# Patient Record
Sex: Female | Born: 1950 | Race: White | Hispanic: No | Marital: Married | Smoking: Never smoker
Health system: Southern US, Community
[De-identification: ages and names within clinical notes are randomized; demographics above are authoritative.]

## PROBLEM LIST (undated history)

## (undated) DIAGNOSIS — E785 Hyperlipidemia, unspecified: Secondary | ICD-10-CM

## (undated) DIAGNOSIS — R918 Other nonspecific abnormal finding of lung field: Secondary | ICD-10-CM

## (undated) HISTORY — DX: Hyperlipidemia, unspecified: E78.5

## (undated) HISTORY — PX: TUBAL LIGATION: SHX77

## (undated) HISTORY — DX: Other nonspecific abnormal finding of lung field: R91.8

---

## 1998-11-10 ENCOUNTER — Other Ambulatory Visit: Admission: RE | Admit: 1998-11-10 | Discharge: 1998-11-10 | Payer: Self-pay | Admitting: Obstetrics and Gynecology

## 2000-02-08 ENCOUNTER — Other Ambulatory Visit: Admission: RE | Admit: 2000-02-08 | Discharge: 2000-02-08 | Payer: Self-pay | Admitting: Obstetrics and Gynecology

## 2001-02-13 ENCOUNTER — Other Ambulatory Visit: Admission: RE | Admit: 2001-02-13 | Discharge: 2001-02-13 | Payer: Self-pay | Admitting: Obstetrics and Gynecology

## 2002-02-26 ENCOUNTER — Other Ambulatory Visit: Admission: RE | Admit: 2002-02-26 | Discharge: 2002-02-26 | Payer: Self-pay | Admitting: Obstetrics and Gynecology

## 2002-07-26 ENCOUNTER — Other Ambulatory Visit: Admission: RE | Admit: 2002-07-26 | Discharge: 2002-07-26 | Payer: Self-pay | Admitting: Obstetrics and Gynecology

## 2003-09-18 ENCOUNTER — Other Ambulatory Visit: Admission: RE | Admit: 2003-09-18 | Discharge: 2003-09-18 | Payer: Self-pay | Admitting: Obstetrics and Gynecology

## 2005-02-01 ENCOUNTER — Ambulatory Visit: Payer: Self-pay | Admitting: Internal Medicine

## 2005-02-24 ENCOUNTER — Ambulatory Visit: Payer: Self-pay | Admitting: Internal Medicine

## 2005-02-26 ENCOUNTER — Ambulatory Visit: Payer: Self-pay | Admitting: Internal Medicine

## 2005-03-01 ENCOUNTER — Ambulatory Visit: Payer: Self-pay | Admitting: Internal Medicine

## 2005-03-04 ENCOUNTER — Ambulatory Visit: Payer: Self-pay | Admitting: Internal Medicine

## 2005-03-08 ENCOUNTER — Ambulatory Visit: Payer: Self-pay | Admitting: Internal Medicine

## 2005-03-12 ENCOUNTER — Ambulatory Visit: Payer: Self-pay | Admitting: Internal Medicine

## 2005-03-16 ENCOUNTER — Ambulatory Visit: Payer: Self-pay | Admitting: Internal Medicine

## 2005-03-19 ENCOUNTER — Ambulatory Visit: Payer: Self-pay | Admitting: Internal Medicine

## 2005-03-23 ENCOUNTER — Ambulatory Visit: Payer: Self-pay | Admitting: Internal Medicine

## 2005-03-25 ENCOUNTER — Ambulatory Visit: Payer: Self-pay | Admitting: Internal Medicine

## 2005-03-26 ENCOUNTER — Ambulatory Visit: Payer: Self-pay | Admitting: Internal Medicine

## 2005-03-30 ENCOUNTER — Ambulatory Visit: Payer: Self-pay | Admitting: Internal Medicine

## 2005-04-01 ENCOUNTER — Ambulatory Visit: Payer: Self-pay | Admitting: Internal Medicine

## 2005-04-06 ENCOUNTER — Ambulatory Visit: Payer: Self-pay | Admitting: Internal Medicine

## 2005-04-09 ENCOUNTER — Ambulatory Visit: Payer: Self-pay | Admitting: Internal Medicine

## 2005-04-13 ENCOUNTER — Ambulatory Visit: Payer: Self-pay | Admitting: Internal Medicine

## 2005-04-19 ENCOUNTER — Ambulatory Visit: Payer: Self-pay | Admitting: Internal Medicine

## 2005-04-27 ENCOUNTER — Ambulatory Visit: Payer: Self-pay | Admitting: Internal Medicine

## 2005-04-30 ENCOUNTER — Ambulatory Visit: Payer: Self-pay | Admitting: Internal Medicine

## 2005-05-03 ENCOUNTER — Ambulatory Visit: Payer: Self-pay | Admitting: Internal Medicine

## 2005-05-06 ENCOUNTER — Ambulatory Visit: Payer: Self-pay | Admitting: Internal Medicine

## 2005-05-12 ENCOUNTER — Ambulatory Visit: Payer: Self-pay | Admitting: Internal Medicine

## 2005-05-14 ENCOUNTER — Ambulatory Visit: Payer: Self-pay | Admitting: Internal Medicine

## 2005-05-18 ENCOUNTER — Ambulatory Visit: Payer: Self-pay | Admitting: Internal Medicine

## 2005-05-20 ENCOUNTER — Ambulatory Visit: Payer: Self-pay | Admitting: Internal Medicine

## 2005-05-26 ENCOUNTER — Ambulatory Visit: Payer: Self-pay | Admitting: Internal Medicine

## 2005-05-28 ENCOUNTER — Ambulatory Visit: Payer: Self-pay | Admitting: Internal Medicine

## 2005-05-31 ENCOUNTER — Ambulatory Visit: Payer: Self-pay | Admitting: Internal Medicine

## 2005-06-03 ENCOUNTER — Ambulatory Visit: Payer: Self-pay | Admitting: Internal Medicine

## 2005-06-09 ENCOUNTER — Ambulatory Visit: Payer: Self-pay | Admitting: Internal Medicine

## 2005-06-11 ENCOUNTER — Ambulatory Visit: Payer: Self-pay | Admitting: Internal Medicine

## 2005-06-16 ENCOUNTER — Ambulatory Visit: Payer: Self-pay | Admitting: Internal Medicine

## 2005-06-22 ENCOUNTER — Ambulatory Visit: Payer: Self-pay | Admitting: Internal Medicine

## 2005-06-30 ENCOUNTER — Ambulatory Visit: Payer: Self-pay | Admitting: Internal Medicine

## 2005-07-07 ENCOUNTER — Ambulatory Visit: Payer: Self-pay | Admitting: Internal Medicine

## 2005-07-12 ENCOUNTER — Ambulatory Visit: Payer: Self-pay | Admitting: Internal Medicine

## 2005-07-20 ENCOUNTER — Ambulatory Visit: Payer: Self-pay | Admitting: Internal Medicine

## 2005-07-27 ENCOUNTER — Ambulatory Visit: Payer: Self-pay | Admitting: Internal Medicine

## 2005-08-05 ENCOUNTER — Ambulatory Visit: Payer: Self-pay | Admitting: Internal Medicine

## 2005-08-11 ENCOUNTER — Ambulatory Visit: Payer: Self-pay | Admitting: Internal Medicine

## 2005-08-19 ENCOUNTER — Ambulatory Visit: Payer: Self-pay | Admitting: Internal Medicine

## 2005-08-27 ENCOUNTER — Ambulatory Visit: Payer: Self-pay | Admitting: Internal Medicine

## 2005-09-03 ENCOUNTER — Ambulatory Visit: Payer: Self-pay | Admitting: Internal Medicine

## 2005-09-06 ENCOUNTER — Ambulatory Visit: Payer: Self-pay | Admitting: Internal Medicine

## 2005-09-10 ENCOUNTER — Ambulatory Visit: Payer: Self-pay | Admitting: Internal Medicine

## 2005-09-17 ENCOUNTER — Ambulatory Visit: Payer: Self-pay | Admitting: Internal Medicine

## 2005-09-24 ENCOUNTER — Ambulatory Visit: Payer: Self-pay | Admitting: Internal Medicine

## 2005-10-01 ENCOUNTER — Ambulatory Visit: Payer: Self-pay | Admitting: Internal Medicine

## 2005-10-08 ENCOUNTER — Ambulatory Visit: Payer: Self-pay | Admitting: Internal Medicine

## 2005-10-18 ENCOUNTER — Ambulatory Visit: Payer: Self-pay | Admitting: Internal Medicine

## 2005-10-25 ENCOUNTER — Ambulatory Visit: Payer: Self-pay | Admitting: Internal Medicine

## 2005-11-01 ENCOUNTER — Ambulatory Visit: Payer: Self-pay | Admitting: Internal Medicine

## 2005-11-08 ENCOUNTER — Ambulatory Visit: Payer: Self-pay | Admitting: Internal Medicine

## 2005-11-15 ENCOUNTER — Ambulatory Visit: Payer: Self-pay | Admitting: Internal Medicine

## 2005-11-22 ENCOUNTER — Ambulatory Visit: Payer: Self-pay | Admitting: Internal Medicine

## 2005-12-06 ENCOUNTER — Ambulatory Visit: Payer: Self-pay | Admitting: Internal Medicine

## 2005-12-13 ENCOUNTER — Ambulatory Visit: Payer: Self-pay | Admitting: Internal Medicine

## 2005-12-21 ENCOUNTER — Ambulatory Visit: Payer: Self-pay | Admitting: Internal Medicine

## 2005-12-28 ENCOUNTER — Ambulatory Visit: Payer: Self-pay | Admitting: Internal Medicine

## 2006-01-05 ENCOUNTER — Ambulatory Visit: Payer: Self-pay | Admitting: Internal Medicine

## 2006-01-11 ENCOUNTER — Ambulatory Visit: Payer: Self-pay | Admitting: Internal Medicine

## 2006-01-18 ENCOUNTER — Ambulatory Visit: Payer: Self-pay | Admitting: Internal Medicine

## 2006-01-25 ENCOUNTER — Ambulatory Visit: Payer: Self-pay | Admitting: Internal Medicine

## 2006-02-01 ENCOUNTER — Ambulatory Visit: Payer: Self-pay | Admitting: Internal Medicine

## 2006-02-02 ENCOUNTER — Ambulatory Visit: Payer: Self-pay | Admitting: Internal Medicine

## 2006-02-08 ENCOUNTER — Ambulatory Visit: Payer: Self-pay | Admitting: Internal Medicine

## 2006-02-17 ENCOUNTER — Ambulatory Visit: Payer: Self-pay | Admitting: Internal Medicine

## 2006-02-25 ENCOUNTER — Ambulatory Visit: Payer: Self-pay | Admitting: Internal Medicine

## 2006-03-14 ENCOUNTER — Ambulatory Visit: Payer: Self-pay | Admitting: Internal Medicine

## 2006-03-22 ENCOUNTER — Ambulatory Visit: Payer: Self-pay | Admitting: Internal Medicine

## 2006-03-29 ENCOUNTER — Ambulatory Visit: Payer: Self-pay | Admitting: Internal Medicine

## 2006-04-05 ENCOUNTER — Ambulatory Visit: Payer: Self-pay | Admitting: Internal Medicine

## 2006-04-13 ENCOUNTER — Ambulatory Visit: Payer: Self-pay | Admitting: Internal Medicine

## 2006-04-20 ENCOUNTER — Ambulatory Visit: Payer: Self-pay | Admitting: Internal Medicine

## 2006-04-26 ENCOUNTER — Ambulatory Visit: Payer: Self-pay | Admitting: Internal Medicine

## 2006-05-03 ENCOUNTER — Ambulatory Visit: Payer: Self-pay | Admitting: Internal Medicine

## 2006-05-11 ENCOUNTER — Ambulatory Visit: Payer: Self-pay | Admitting: Internal Medicine

## 2006-05-19 ENCOUNTER — Ambulatory Visit: Payer: Self-pay | Admitting: Internal Medicine

## 2006-05-26 ENCOUNTER — Ambulatory Visit: Payer: Self-pay | Admitting: Internal Medicine

## 2006-05-31 ENCOUNTER — Ambulatory Visit: Payer: Self-pay | Admitting: Internal Medicine

## 2006-06-07 ENCOUNTER — Ambulatory Visit: Payer: Self-pay | Admitting: Internal Medicine

## 2006-06-14 ENCOUNTER — Ambulatory Visit: Payer: Self-pay | Admitting: Internal Medicine

## 2006-06-21 ENCOUNTER — Ambulatory Visit: Payer: Self-pay | Admitting: Internal Medicine

## 2006-06-22 ENCOUNTER — Ambulatory Visit: Payer: Self-pay | Admitting: Internal Medicine

## 2006-06-28 ENCOUNTER — Ambulatory Visit: Payer: Self-pay | Admitting: Internal Medicine

## 2006-07-06 ENCOUNTER — Ambulatory Visit: Payer: Self-pay | Admitting: Internal Medicine

## 2006-07-12 ENCOUNTER — Ambulatory Visit: Payer: Self-pay | Admitting: Internal Medicine

## 2006-07-20 ENCOUNTER — Ambulatory Visit: Payer: Self-pay | Admitting: Internal Medicine

## 2006-07-28 ENCOUNTER — Ambulatory Visit: Payer: Self-pay | Admitting: Internal Medicine

## 2006-08-03 ENCOUNTER — Ambulatory Visit: Payer: Self-pay | Admitting: Internal Medicine

## 2006-08-10 ENCOUNTER — Ambulatory Visit: Payer: Self-pay | Admitting: Internal Medicine

## 2006-08-17 ENCOUNTER — Ambulatory Visit: Payer: Self-pay | Admitting: Internal Medicine

## 2006-08-24 ENCOUNTER — Ambulatory Visit: Payer: Self-pay | Admitting: Internal Medicine

## 2006-09-01 ENCOUNTER — Ambulatory Visit: Payer: Self-pay | Admitting: Internal Medicine

## 2006-09-07 ENCOUNTER — Ambulatory Visit: Payer: Self-pay | Admitting: Internal Medicine

## 2006-09-16 ENCOUNTER — Ambulatory Visit: Payer: Self-pay | Admitting: Internal Medicine

## 2006-09-21 ENCOUNTER — Ambulatory Visit: Payer: Self-pay | Admitting: Internal Medicine

## 2006-09-29 ENCOUNTER — Ambulatory Visit: Payer: Self-pay | Admitting: Internal Medicine

## 2006-10-07 ENCOUNTER — Ambulatory Visit: Payer: Self-pay | Admitting: Internal Medicine

## 2006-10-14 ENCOUNTER — Ambulatory Visit: Payer: Self-pay | Admitting: Internal Medicine

## 2006-10-19 ENCOUNTER — Ambulatory Visit: Payer: Self-pay | Admitting: Internal Medicine

## 2006-10-24 ENCOUNTER — Ambulatory Visit: Payer: Self-pay | Admitting: Internal Medicine

## 2006-10-28 ENCOUNTER — Ambulatory Visit: Payer: Self-pay | Admitting: Internal Medicine

## 2006-11-02 ENCOUNTER — Ambulatory Visit: Payer: Self-pay | Admitting: Internal Medicine

## 2006-11-08 ENCOUNTER — Ambulatory Visit: Payer: Self-pay | Admitting: Internal Medicine

## 2006-11-21 ENCOUNTER — Ambulatory Visit: Payer: Self-pay | Admitting: Internal Medicine

## 2006-12-01 ENCOUNTER — Ambulatory Visit: Payer: Self-pay | Admitting: Internal Medicine

## 2006-12-08 ENCOUNTER — Ambulatory Visit: Payer: Self-pay | Admitting: Internal Medicine

## 2006-12-15 ENCOUNTER — Ambulatory Visit: Payer: Self-pay | Admitting: Internal Medicine

## 2006-12-22 ENCOUNTER — Ambulatory Visit: Payer: Self-pay | Admitting: Internal Medicine

## 2006-12-30 ENCOUNTER — Ambulatory Visit: Payer: Self-pay | Admitting: Internal Medicine

## 2007-01-06 ENCOUNTER — Ambulatory Visit: Payer: Self-pay | Admitting: Internal Medicine

## 2007-01-12 ENCOUNTER — Ambulatory Visit: Payer: Self-pay | Admitting: Internal Medicine

## 2007-01-20 ENCOUNTER — Ambulatory Visit: Payer: Self-pay | Admitting: Internal Medicine

## 2007-01-27 ENCOUNTER — Ambulatory Visit: Payer: Self-pay | Admitting: Internal Medicine

## 2007-02-03 ENCOUNTER — Ambulatory Visit: Payer: Self-pay | Admitting: Internal Medicine

## 2007-02-10 ENCOUNTER — Ambulatory Visit: Payer: Self-pay | Admitting: Internal Medicine

## 2007-02-17 ENCOUNTER — Ambulatory Visit: Payer: Self-pay | Admitting: Internal Medicine

## 2007-02-23 ENCOUNTER — Ambulatory Visit: Payer: Self-pay | Admitting: Internal Medicine

## 2007-03-03 ENCOUNTER — Ambulatory Visit: Payer: Self-pay | Admitting: Internal Medicine

## 2007-03-09 ENCOUNTER — Ambulatory Visit: Payer: Self-pay | Admitting: Internal Medicine

## 2007-03-10 ENCOUNTER — Ambulatory Visit: Payer: Self-pay | Admitting: Internal Medicine

## 2007-03-17 ENCOUNTER — Ambulatory Visit: Payer: Self-pay | Admitting: Internal Medicine

## 2007-03-24 ENCOUNTER — Ambulatory Visit: Payer: Self-pay | Admitting: Internal Medicine

## 2007-03-31 ENCOUNTER — Ambulatory Visit: Payer: Self-pay | Admitting: Internal Medicine

## 2007-04-07 ENCOUNTER — Ambulatory Visit: Payer: Self-pay | Admitting: Internal Medicine

## 2007-04-14 ENCOUNTER — Ambulatory Visit: Payer: Self-pay | Admitting: Internal Medicine

## 2007-04-20 ENCOUNTER — Ambulatory Visit: Payer: Self-pay | Admitting: Internal Medicine

## 2007-04-27 ENCOUNTER — Ambulatory Visit: Payer: Self-pay | Admitting: Internal Medicine

## 2007-05-08 ENCOUNTER — Ambulatory Visit: Payer: Self-pay | Admitting: Internal Medicine

## 2007-05-15 ENCOUNTER — Ambulatory Visit: Payer: Self-pay | Admitting: Internal Medicine

## 2007-05-24 ENCOUNTER — Ambulatory Visit: Payer: Self-pay | Admitting: Internal Medicine

## 2007-05-31 ENCOUNTER — Ambulatory Visit: Payer: Self-pay | Admitting: Internal Medicine

## 2007-06-07 ENCOUNTER — Ambulatory Visit: Payer: Self-pay | Admitting: Internal Medicine

## 2007-06-14 ENCOUNTER — Ambulatory Visit: Payer: Self-pay | Admitting: Internal Medicine

## 2007-06-15 DIAGNOSIS — J309 Allergic rhinitis, unspecified: Secondary | ICD-10-CM | POA: Insufficient documentation

## 2007-06-21 ENCOUNTER — Ambulatory Visit: Payer: Self-pay | Admitting: Internal Medicine

## 2007-06-28 ENCOUNTER — Ambulatory Visit: Payer: Self-pay | Admitting: Internal Medicine

## 2007-07-06 ENCOUNTER — Ambulatory Visit: Payer: Self-pay | Admitting: Internal Medicine

## 2007-07-07 ENCOUNTER — Ambulatory Visit: Payer: Self-pay | Admitting: Internal Medicine

## 2007-07-13 ENCOUNTER — Ambulatory Visit: Payer: Self-pay | Admitting: Internal Medicine

## 2007-07-24 ENCOUNTER — Ambulatory Visit: Payer: Self-pay | Admitting: Internal Medicine

## 2007-08-01 ENCOUNTER — Ambulatory Visit: Payer: Self-pay | Admitting: Internal Medicine

## 2007-08-09 ENCOUNTER — Ambulatory Visit: Payer: Self-pay | Admitting: Internal Medicine

## 2007-08-21 ENCOUNTER — Ambulatory Visit: Payer: Self-pay | Admitting: Internal Medicine

## 2007-09-01 ENCOUNTER — Ambulatory Visit: Payer: Self-pay | Admitting: Internal Medicine

## 2007-09-08 ENCOUNTER — Ambulatory Visit: Payer: Self-pay | Admitting: Internal Medicine

## 2007-09-15 ENCOUNTER — Ambulatory Visit: Payer: Self-pay | Admitting: Internal Medicine

## 2007-09-22 ENCOUNTER — Ambulatory Visit: Payer: Self-pay | Admitting: Internal Medicine

## 2007-09-29 ENCOUNTER — Ambulatory Visit: Payer: Self-pay | Admitting: Internal Medicine

## 2007-10-10 ENCOUNTER — Ambulatory Visit: Payer: Self-pay | Admitting: Internal Medicine

## 2007-10-18 ENCOUNTER — Ambulatory Visit: Payer: Self-pay | Admitting: Internal Medicine

## 2007-10-24 ENCOUNTER — Ambulatory Visit: Payer: Self-pay | Admitting: Internal Medicine

## 2007-11-02 ENCOUNTER — Ambulatory Visit: Payer: Self-pay | Admitting: Internal Medicine

## 2007-11-10 ENCOUNTER — Ambulatory Visit: Payer: Self-pay | Admitting: Internal Medicine

## 2007-11-16 ENCOUNTER — Ambulatory Visit: Payer: Self-pay | Admitting: Internal Medicine

## 2007-11-24 ENCOUNTER — Ambulatory Visit: Payer: Self-pay | Admitting: Internal Medicine

## 2007-11-27 ENCOUNTER — Ambulatory Visit: Payer: Self-pay | Admitting: Internal Medicine

## 2007-11-29 ENCOUNTER — Ambulatory Visit: Payer: Self-pay | Admitting: Internal Medicine

## 2007-12-12 ENCOUNTER — Ambulatory Visit: Payer: Self-pay | Admitting: Internal Medicine

## 2007-12-20 ENCOUNTER — Ambulatory Visit: Payer: Self-pay | Admitting: Internal Medicine

## 2007-12-27 ENCOUNTER — Ambulatory Visit: Payer: Self-pay | Admitting: Internal Medicine

## 2008-01-03 ENCOUNTER — Ambulatory Visit: Payer: Self-pay | Admitting: Internal Medicine

## 2008-01-11 ENCOUNTER — Ambulatory Visit: Payer: Self-pay | Admitting: Internal Medicine

## 2008-01-19 ENCOUNTER — Ambulatory Visit: Payer: Self-pay | Admitting: Internal Medicine

## 2008-01-26 ENCOUNTER — Ambulatory Visit: Payer: Self-pay | Admitting: Internal Medicine

## 2008-02-02 ENCOUNTER — Ambulatory Visit: Payer: Self-pay | Admitting: Internal Medicine

## 2008-02-09 ENCOUNTER — Ambulatory Visit: Payer: Self-pay | Admitting: Internal Medicine

## 2008-02-16 ENCOUNTER — Ambulatory Visit: Payer: Self-pay | Admitting: Internal Medicine

## 2008-02-22 ENCOUNTER — Ambulatory Visit: Payer: Self-pay | Admitting: Internal Medicine

## 2008-02-29 ENCOUNTER — Ambulatory Visit: Payer: Self-pay | Admitting: Internal Medicine

## 2008-03-08 ENCOUNTER — Ambulatory Visit: Payer: Self-pay | Admitting: Internal Medicine

## 2008-03-15 ENCOUNTER — Ambulatory Visit: Payer: Self-pay | Admitting: Internal Medicine

## 2008-03-22 ENCOUNTER — Ambulatory Visit: Payer: Self-pay | Admitting: Internal Medicine

## 2008-03-28 ENCOUNTER — Ambulatory Visit: Payer: Self-pay | Admitting: Internal Medicine

## 2008-04-05 ENCOUNTER — Ambulatory Visit: Payer: Self-pay | Admitting: Internal Medicine

## 2008-04-09 ENCOUNTER — Ambulatory Visit: Payer: Self-pay | Admitting: Internal Medicine

## 2008-04-15 ENCOUNTER — Ambulatory Visit: Payer: Self-pay | Admitting: Internal Medicine

## 2008-04-22 ENCOUNTER — Ambulatory Visit: Payer: Self-pay | Admitting: Internal Medicine

## 2008-04-30 ENCOUNTER — Ambulatory Visit: Payer: Self-pay | Admitting: Internal Medicine

## 2008-05-06 ENCOUNTER — Ambulatory Visit: Payer: Self-pay | Admitting: Internal Medicine

## 2008-05-14 ENCOUNTER — Ambulatory Visit: Payer: Self-pay | Admitting: Internal Medicine

## 2008-05-21 ENCOUNTER — Ambulatory Visit: Payer: Self-pay | Admitting: Internal Medicine

## 2008-05-29 ENCOUNTER — Ambulatory Visit: Payer: Self-pay | Admitting: Internal Medicine

## 2008-06-06 ENCOUNTER — Ambulatory Visit: Payer: Self-pay | Admitting: Internal Medicine

## 2008-06-13 ENCOUNTER — Ambulatory Visit: Payer: Self-pay | Admitting: Internal Medicine

## 2008-06-20 ENCOUNTER — Ambulatory Visit: Payer: Self-pay | Admitting: Internal Medicine

## 2008-06-27 ENCOUNTER — Ambulatory Visit: Payer: Self-pay | Admitting: Internal Medicine

## 2008-07-04 ENCOUNTER — Ambulatory Visit: Payer: Self-pay | Admitting: Internal Medicine

## 2008-07-11 ENCOUNTER — Ambulatory Visit: Payer: Self-pay | Admitting: Internal Medicine

## 2008-07-16 ENCOUNTER — Ambulatory Visit: Payer: Self-pay | Admitting: Internal Medicine

## 2008-07-25 ENCOUNTER — Ambulatory Visit: Payer: Self-pay | Admitting: Internal Medicine

## 2008-08-02 ENCOUNTER — Ambulatory Visit: Payer: Self-pay | Admitting: Pulmonary Disease

## 2008-08-02 ENCOUNTER — Ambulatory Visit: Payer: Self-pay | Admitting: Internal Medicine

## 2008-08-08 ENCOUNTER — Ambulatory Visit: Payer: Self-pay | Admitting: Internal Medicine

## 2008-08-13 ENCOUNTER — Ambulatory Visit: Payer: Self-pay | Admitting: Internal Medicine

## 2008-08-14 ENCOUNTER — Ambulatory Visit: Payer: Self-pay | Admitting: Internal Medicine

## 2008-08-21 ENCOUNTER — Ambulatory Visit: Payer: Self-pay | Admitting: Internal Medicine

## 2008-08-23 HISTORY — PX: CHOLECYSTECTOMY: SHX55

## 2008-08-27 ENCOUNTER — Ambulatory Visit: Payer: Self-pay | Admitting: Internal Medicine

## 2008-08-28 ENCOUNTER — Ambulatory Visit: Payer: Self-pay | Admitting: Internal Medicine

## 2008-09-06 ENCOUNTER — Ambulatory Visit: Payer: Self-pay | Admitting: Internal Medicine

## 2008-09-13 ENCOUNTER — Ambulatory Visit: Payer: Self-pay | Admitting: Internal Medicine

## 2008-09-20 ENCOUNTER — Ambulatory Visit: Payer: Self-pay | Admitting: Internal Medicine

## 2008-09-26 ENCOUNTER — Ambulatory Visit: Payer: Self-pay | Admitting: Internal Medicine

## 2008-10-03 ENCOUNTER — Ambulatory Visit: Payer: Self-pay | Admitting: Internal Medicine

## 2008-10-11 ENCOUNTER — Ambulatory Visit: Payer: Self-pay | Admitting: Internal Medicine

## 2008-10-18 ENCOUNTER — Ambulatory Visit: Payer: Self-pay | Admitting: Internal Medicine

## 2008-10-25 ENCOUNTER — Ambulatory Visit: Payer: Self-pay | Admitting: Internal Medicine

## 2008-11-01 ENCOUNTER — Ambulatory Visit: Payer: Self-pay | Admitting: Internal Medicine

## 2008-11-14 ENCOUNTER — Encounter: Admission: RE | Admit: 2008-11-14 | Discharge: 2008-11-14 | Payer: Self-pay | Admitting: Family Medicine

## 2008-11-19 ENCOUNTER — Ambulatory Visit: Payer: Self-pay | Admitting: Internal Medicine

## 2008-12-04 ENCOUNTER — Ambulatory Visit: Payer: Self-pay | Admitting: Internal Medicine

## 2008-12-12 ENCOUNTER — Ambulatory Visit: Payer: Self-pay | Admitting: Internal Medicine

## 2008-12-17 ENCOUNTER — Ambulatory Visit (HOSPITAL_COMMUNITY): Admission: RE | Admit: 2008-12-17 | Discharge: 2008-12-17 | Payer: Self-pay | Admitting: Family Medicine

## 2008-12-20 ENCOUNTER — Ambulatory Visit: Payer: Self-pay | Admitting: Internal Medicine

## 2008-12-30 ENCOUNTER — Ambulatory Visit: Payer: Self-pay | Admitting: Internal Medicine

## 2009-01-06 ENCOUNTER — Ambulatory Visit: Payer: Self-pay | Admitting: Internal Medicine

## 2009-01-07 ENCOUNTER — Ambulatory Visit: Payer: Self-pay | Admitting: Internal Medicine

## 2009-01-14 ENCOUNTER — Ambulatory Visit: Payer: Self-pay | Admitting: Internal Medicine

## 2009-01-21 ENCOUNTER — Ambulatory Visit: Payer: Self-pay | Admitting: Internal Medicine

## 2009-01-28 ENCOUNTER — Ambulatory Visit: Payer: Self-pay | Admitting: Internal Medicine

## 2009-02-03 ENCOUNTER — Telehealth: Payer: Self-pay | Admitting: Internal Medicine

## 2009-02-04 ENCOUNTER — Ambulatory Visit: Payer: Self-pay | Admitting: Internal Medicine

## 2009-02-06 ENCOUNTER — Ambulatory Visit (HOSPITAL_COMMUNITY): Admission: RE | Admit: 2009-02-06 | Discharge: 2009-02-06 | Payer: Self-pay | Admitting: Surgery

## 2009-02-06 ENCOUNTER — Encounter (INDEPENDENT_AMBULATORY_CARE_PROVIDER_SITE_OTHER): Payer: Self-pay | Admitting: Surgery

## 2009-02-19 ENCOUNTER — Ambulatory Visit: Payer: Self-pay | Admitting: Internal Medicine

## 2009-02-28 ENCOUNTER — Ambulatory Visit: Payer: Self-pay | Admitting: Internal Medicine

## 2009-03-07 ENCOUNTER — Encounter: Admission: RE | Admit: 2009-03-07 | Discharge: 2009-03-07 | Payer: Self-pay | Admitting: Surgery

## 2009-03-07 ENCOUNTER — Ambulatory Visit: Payer: Self-pay | Admitting: Internal Medicine

## 2009-03-13 ENCOUNTER — Ambulatory Visit: Payer: Self-pay | Admitting: Internal Medicine

## 2009-03-21 ENCOUNTER — Ambulatory Visit: Payer: Self-pay | Admitting: Internal Medicine

## 2009-03-21 DIAGNOSIS — E785 Hyperlipidemia, unspecified: Secondary | ICD-10-CM

## 2009-03-21 DIAGNOSIS — R911 Solitary pulmonary nodule: Secondary | ICD-10-CM

## 2009-03-28 ENCOUNTER — Ambulatory Visit: Payer: Self-pay | Admitting: Internal Medicine

## 2009-04-11 ENCOUNTER — Ambulatory Visit: Payer: Self-pay | Admitting: Internal Medicine

## 2009-04-14 ENCOUNTER — Telehealth (INDEPENDENT_AMBULATORY_CARE_PROVIDER_SITE_OTHER): Payer: Self-pay | Admitting: *Deleted

## 2009-04-18 ENCOUNTER — Ambulatory Visit: Payer: Self-pay | Admitting: Internal Medicine

## 2009-04-25 ENCOUNTER — Ambulatory Visit: Payer: Self-pay | Admitting: Internal Medicine

## 2009-04-30 ENCOUNTER — Ambulatory Visit: Payer: Self-pay | Admitting: Internal Medicine

## 2009-05-02 ENCOUNTER — Ambulatory Visit: Payer: Self-pay | Admitting: Internal Medicine

## 2009-05-09 ENCOUNTER — Ambulatory Visit: Payer: Self-pay | Admitting: Internal Medicine

## 2009-05-16 ENCOUNTER — Ambulatory Visit: Payer: Self-pay | Admitting: Internal Medicine

## 2009-05-21 ENCOUNTER — Ambulatory Visit: Payer: Self-pay | Admitting: Internal Medicine

## 2009-05-23 ENCOUNTER — Ambulatory Visit: Payer: Self-pay | Admitting: Internal Medicine

## 2009-05-30 ENCOUNTER — Ambulatory Visit: Payer: Self-pay | Admitting: Internal Medicine

## 2009-06-06 ENCOUNTER — Ambulatory Visit: Payer: Self-pay | Admitting: Internal Medicine

## 2009-06-13 ENCOUNTER — Telehealth (INDEPENDENT_AMBULATORY_CARE_PROVIDER_SITE_OTHER): Payer: Self-pay | Admitting: *Deleted

## 2009-06-17 ENCOUNTER — Ambulatory Visit: Payer: Self-pay | Admitting: Internal Medicine

## 2009-06-30 ENCOUNTER — Telehealth (INDEPENDENT_AMBULATORY_CARE_PROVIDER_SITE_OTHER): Payer: Self-pay | Admitting: *Deleted

## 2009-06-30 ENCOUNTER — Ambulatory Visit: Payer: Self-pay | Admitting: Internal Medicine

## 2009-07-09 ENCOUNTER — Ambulatory Visit: Payer: Self-pay | Admitting: Internal Medicine

## 2009-07-15 ENCOUNTER — Ambulatory Visit: Payer: Self-pay | Admitting: Internal Medicine

## 2009-07-22 ENCOUNTER — Ambulatory Visit: Payer: Self-pay | Admitting: Internal Medicine

## 2009-07-30 ENCOUNTER — Encounter (INDEPENDENT_AMBULATORY_CARE_PROVIDER_SITE_OTHER): Payer: Self-pay

## 2009-07-31 ENCOUNTER — Ambulatory Visit: Payer: Self-pay | Admitting: Internal Medicine

## 2009-08-01 ENCOUNTER — Telehealth: Payer: Self-pay | Admitting: Internal Medicine

## 2009-08-01 ENCOUNTER — Ambulatory Visit: Payer: Self-pay | Admitting: Internal Medicine

## 2009-08-06 ENCOUNTER — Ambulatory Visit: Payer: Self-pay | Admitting: Internal Medicine

## 2009-08-12 ENCOUNTER — Ambulatory Visit: Payer: Self-pay | Admitting: Internal Medicine

## 2009-08-14 ENCOUNTER — Ambulatory Visit: Payer: Self-pay | Admitting: Internal Medicine

## 2009-08-21 ENCOUNTER — Ambulatory Visit: Payer: Self-pay | Admitting: Internal Medicine

## 2009-08-29 ENCOUNTER — Ambulatory Visit: Payer: Self-pay | Admitting: Internal Medicine

## 2009-09-05 ENCOUNTER — Ambulatory Visit: Payer: Self-pay | Admitting: Internal Medicine

## 2009-09-12 ENCOUNTER — Ambulatory Visit: Payer: Self-pay | Admitting: Internal Medicine

## 2009-09-15 ENCOUNTER — Ambulatory Visit: Payer: Self-pay | Admitting: Internal Medicine

## 2009-09-19 ENCOUNTER — Ambulatory Visit: Payer: Self-pay | Admitting: Internal Medicine

## 2009-09-26 ENCOUNTER — Ambulatory Visit: Payer: Self-pay | Admitting: Internal Medicine

## 2009-10-03 ENCOUNTER — Ambulatory Visit: Payer: Self-pay | Admitting: Internal Medicine

## 2009-10-10 ENCOUNTER — Ambulatory Visit: Payer: Self-pay | Admitting: Internal Medicine

## 2009-10-17 ENCOUNTER — Ambulatory Visit: Payer: Self-pay | Admitting: Internal Medicine

## 2009-10-24 ENCOUNTER — Ambulatory Visit: Payer: Self-pay | Admitting: Internal Medicine

## 2009-10-28 ENCOUNTER — Telehealth (INDEPENDENT_AMBULATORY_CARE_PROVIDER_SITE_OTHER): Payer: Self-pay | Admitting: *Deleted

## 2009-10-31 ENCOUNTER — Ambulatory Visit: Payer: Self-pay | Admitting: Internal Medicine

## 2009-11-06 ENCOUNTER — Telehealth (INDEPENDENT_AMBULATORY_CARE_PROVIDER_SITE_OTHER): Payer: Self-pay | Admitting: *Deleted

## 2009-11-07 ENCOUNTER — Telehealth (INDEPENDENT_AMBULATORY_CARE_PROVIDER_SITE_OTHER): Payer: Self-pay | Admitting: *Deleted

## 2009-11-14 ENCOUNTER — Ambulatory Visit: Payer: Self-pay | Admitting: Internal Medicine

## 2009-11-21 ENCOUNTER — Ambulatory Visit: Payer: Self-pay | Admitting: Internal Medicine

## 2009-11-23 ENCOUNTER — Encounter: Payer: Self-pay | Admitting: Internal Medicine

## 2009-11-25 ENCOUNTER — Telehealth: Payer: Self-pay | Admitting: Internal Medicine

## 2009-11-27 ENCOUNTER — Ambulatory Visit: Payer: Self-pay | Admitting: Internal Medicine

## 2009-12-05 ENCOUNTER — Ambulatory Visit: Payer: Self-pay | Admitting: Internal Medicine

## 2009-12-11 ENCOUNTER — Ambulatory Visit: Payer: Self-pay | Admitting: Internal Medicine

## 2009-12-24 ENCOUNTER — Ambulatory Visit: Payer: Self-pay | Admitting: Internal Medicine

## 2010-01-02 ENCOUNTER — Ambulatory Visit: Payer: Self-pay | Admitting: Internal Medicine

## 2010-01-09 ENCOUNTER — Ambulatory Visit: Payer: Self-pay | Admitting: Internal Medicine

## 2010-01-20 ENCOUNTER — Ambulatory Visit: Payer: Self-pay | Admitting: Internal Medicine

## 2010-01-30 ENCOUNTER — Ambulatory Visit: Payer: Self-pay | Admitting: Internal Medicine

## 2010-02-06 ENCOUNTER — Ambulatory Visit: Payer: Self-pay | Admitting: Internal Medicine

## 2010-02-06 ENCOUNTER — Telehealth (INDEPENDENT_AMBULATORY_CARE_PROVIDER_SITE_OTHER): Payer: Self-pay | Admitting: *Deleted

## 2010-02-16 ENCOUNTER — Ambulatory Visit: Payer: Self-pay | Admitting: Internal Medicine

## 2010-02-25 ENCOUNTER — Ambulatory Visit: Payer: Self-pay | Admitting: Internal Medicine

## 2010-02-26 ENCOUNTER — Ambulatory Visit: Payer: Self-pay | Admitting: Internal Medicine

## 2010-03-06 ENCOUNTER — Ambulatory Visit: Payer: Self-pay | Admitting: Internal Medicine

## 2010-03-12 ENCOUNTER — Ambulatory Visit: Payer: Self-pay | Admitting: Internal Medicine

## 2010-03-24 ENCOUNTER — Ambulatory Visit: Payer: Self-pay | Admitting: Internal Medicine

## 2010-04-01 ENCOUNTER — Ambulatory Visit: Payer: Self-pay | Admitting: Internal Medicine

## 2010-04-14 ENCOUNTER — Ambulatory Visit: Payer: Self-pay | Admitting: Internal Medicine

## 2010-04-22 ENCOUNTER — Ambulatory Visit: Payer: Self-pay | Admitting: Internal Medicine

## 2010-05-01 ENCOUNTER — Ambulatory Visit: Payer: Self-pay | Admitting: Internal Medicine

## 2010-05-08 ENCOUNTER — Ambulatory Visit: Payer: Self-pay | Admitting: Internal Medicine

## 2010-05-15 ENCOUNTER — Ambulatory Visit: Payer: Self-pay | Admitting: Internal Medicine

## 2010-05-25 ENCOUNTER — Ambulatory Visit: Payer: Self-pay | Admitting: Internal Medicine

## 2010-06-03 ENCOUNTER — Ambulatory Visit: Payer: Self-pay | Admitting: Internal Medicine

## 2010-06-08 ENCOUNTER — Telehealth (INDEPENDENT_AMBULATORY_CARE_PROVIDER_SITE_OTHER): Payer: Self-pay | Admitting: *Deleted

## 2010-06-12 ENCOUNTER — Ambulatory Visit: Payer: Self-pay | Admitting: Internal Medicine

## 2010-06-22 ENCOUNTER — Ambulatory Visit: Payer: Self-pay | Admitting: Internal Medicine

## 2010-06-29 ENCOUNTER — Ambulatory Visit: Payer: Self-pay | Admitting: Internal Medicine

## 2010-07-07 ENCOUNTER — Ambulatory Visit: Payer: Self-pay | Admitting: Internal Medicine

## 2010-07-13 ENCOUNTER — Ambulatory Visit: Payer: Self-pay | Admitting: Internal Medicine

## 2010-07-23 ENCOUNTER — Telehealth: Payer: Self-pay | Admitting: Internal Medicine

## 2010-07-27 ENCOUNTER — Ambulatory Visit: Payer: Self-pay | Admitting: Internal Medicine

## 2010-08-03 ENCOUNTER — Ambulatory Visit: Payer: Self-pay | Admitting: Internal Medicine

## 2010-08-10 ENCOUNTER — Ambulatory Visit: Payer: Self-pay | Admitting: Internal Medicine

## 2010-08-18 ENCOUNTER — Ambulatory Visit: Payer: Self-pay | Admitting: Internal Medicine

## 2010-08-21 ENCOUNTER — Ambulatory Visit: Payer: Self-pay | Admitting: Internal Medicine

## 2010-09-05 ENCOUNTER — Ambulatory Visit: Payer: Self-pay | Admitting: Internal Medicine

## 2010-09-11 ENCOUNTER — Ambulatory Visit: Payer: Self-pay | Admitting: Internal Medicine

## 2010-09-17 ENCOUNTER — Ambulatory Visit: Payer: Self-pay | Admitting: Internal Medicine

## 2010-09-20 ENCOUNTER — Ambulatory Visit: Payer: Self-pay | Admitting: Internal Medicine

## 2010-09-24 NOTE — Progress Notes (Signed)
Summary: sinus infection   Phone Note Call from Patient Call back at [phone]   Caller: Patient Call For: young Summary of Call: sinus infection [facility] [address] Initial call taken by: Rickard Patience,  February 06, 2010 9:12 AM  Follow-up for Phone Call        Spoke with pt.  Pt c/o "pounding" sinus headache, drainage, throat red and swollen, and ear pressure since Monday night.  Denies fever.  Allergies: Codies, Darvon, Dextromthorphan Mudlogger. Will forward message to CY-pls advise.  Thanks!  Gweneth Dimitri RN  February 06, 2010 9:28 AM   Additional Follow-up for Phone Call Additional follow up Details #1::        Per CDY- give Augmentin 500mg  (GENERIC OK) #14 take 1 by mouth two times a day  no refills. Mucinex, sudafed PE OTC, and Nasal saline rinse.Reynaldo Minium CMA  February 06, 2010 10:31 AM     Additional Follow-up for Phone Call Additional follow up Details #2::    ATC above number-did not ring, went directly to voicemail.  [facility] Crystal Jones RN  February 06, 2010 10:35 AM I gave this info to pt (ok per td) re: rx for augmentin and also dr youngs recs. nothing else needed per pt. if this has been sent to pharmacy then were done- thanks. Tivis Ringer, CNA  February 06, 2010 12:16 PM   New/Updated Medications: AUGMENTIN 500-125 MG TABS (AMOXICILLIN-POT CLAVULANATE) take 1 tablet by mouth two times a day Prescriptions: AUGMENTIN 500-125 MG TABS (AMOXICILLIN-POT CLAVULANATE) take 1 tablet by mouth two times a day  #14 x 0   Entered by:   Gweneth Dimitri RN   Authorized by:   Waymon Budge MD   Signed by:   Tivis Ringer, CNA on 02/06/2010   Method used:   Electronically to        [facility] [phone]* (retail)       [address]       [place], [place]  [address]       Ph: [phone]       Fax: [phone]   RxID:   [id number]

## 2010-09-24 NOTE — Miscellaneous (Signed)
Summary: Injection Record / [facility]    Injection Record /  Allergy    Imported By: Lennie Odor 01/13/2010 15:17:20  _____________________________________________________________________  External Attachment:    Type:   Image     Comment:   External Document

## 2010-09-24 NOTE — Progress Notes (Signed)
Summary: sore throat/cb-  Phone Note Call from Patient Call back at Work Phone [phone]   Caller: Patient Call For: young Summary of Call: pt just finished meds for sinus infection and this week she has had  a sore throat and feels like its backing up into her ears Initial call taken by: Lacinda Axon,  November 06, 2009 8:09 AM  Follow-up for Phone Call        [facility]. Carron Curie CMA  November 06, 2009 9:36 AM  when calling the pt back just tell who answers to go get her per pt .Marland KitchenChantel Bowne  November 06, 2009 9:41 AM  Pt states she finished zpak last week and felt better, but then on Monday of this week she began to have a scratchy throat, PND, pressure in her ears again. She ahs been using mucinex, chloraseptic spray, netti pot and tylenol as directed with no relief. Please advise.Carron Curie CMA  November 06, 2009 10:29 AM allergies: coddeine, darvon, dextromethorphan    Additional Follow-up for Phone Call Additional follow up Details #1::        Per CDY- give Doxycycline 100mg  # 8 take 2 today then 1 daily until gone. No refills.Reynaldo Minium CMA  November 06, 2009 12:04 PM   pt aware med sent to pharmacy Additional Follow-up by: Philipp Deputy CMA,  November 06, 2009 12:10 PM    New/Updated Medications: DOXEPIN HCL 100 MG CAPS (DOXEPIN HCL) 2 today then 1 daily till gone Prescriptions: DOXEPIN HCL 100 MG CAPS (DOXEPIN HCL) 2 today then 1 daily till gone  #8 x 0   Entered by:   Philipp Deputy CMA   Authorized by:   Waymon Budge MD   Signed by:   Philipp Deputy CMA on 11/06/2009   Method used:   Electronically to        [facility]. [phone]* (retail)       [address]       [place], [place]  [address]       Ph: [phone]       Fax: [phone]   RxID:   [id number]

## 2010-09-24 NOTE — Miscellaneous (Signed)
Summary: Injection record  Injection record   Imported By: Lester Jersey 09/04/2010 11:52:01  _____________________________________________________________________  External Attachment:    Type:   Image     Comment:   External Document

## 2010-09-24 NOTE — Progress Notes (Signed)
Summary: adverse medication effect  Phone Note Outgoing Call   Summary of Call: [name] [name] had received a script for doxepin on 11/06/08 instead of doxycycline as intended. I was informed of what had happened after she had reported associated malaise and changed to erythromycin. I understood she had been spoken to by the nurse, but [name] [name] sent a letter so I called her back tonight and apologized for the mistake. She is now back to baseline. She indicated understanding and indicated she appreciated the call. Initial call taken by: Waymon Budge MD,  November 25, 2009 8:45 PM  Follow-up for Phone Call        Spoke with Dr Maple Hudson to be sure pt was  ok I also filed a safety zone portal  so they could send to [name] [name], I also spoke to [name] [name] in person about this.   Follow-up by: Philipp Deputy CMA,  November 26, 2009 5:20 PM

## 2010-09-24 NOTE — Miscellaneous (Signed)
Summary: Injection Record/[facility]  Injection Record/[facility]   Imported By: Sherian Rein 02/12/2010 08:38:39  _____________________________________________________________________  External Attachment:    Type:   Image     Comment:   External Document

## 2010-09-24 NOTE — Assessment & Plan Note (Signed)
Summary: ROV/ MBW   Primary Provider/Referring Provider:  Gweneth Dimitri  CC:  Follow up visit-allergies; nasal congestion x 1 week; tried Sudafed, Mucinex, and and Neti pot without relief..  History of Present Illness: 11/08- PROBLEM:  Allergic rhinitis.  HISTORY:  She says she is doing extremely well on current management and wants no changes.  Singulair was no help.  Mucinex D helps but upsets her stomach.  Continues allergy vaccine at 1 to 10 with no problems. Has had flu vaccine.  She stopped using Rhinocort but uses Nasacort for intervals appropriately p.r.n.   2009-04-03 Allergic rhinitis, Lung nodules here for annual f/u but reports incidental bad sore throat earlier this week, treated with salt vgargle. has felt hot without chilling. Mild cough scant mucus. Nose clear and ears ok. Has done fine with allergy vaccine- gets here without complication or concern. Also questioned CT done at recent gall bladder surgery- 1 Altieri noncalc RUL nodule and one smal calc RLL nodule. We reviewed her images and report.  May 01, 2010- Allergic rhinitis, Lung nodules on CXR/ 2010  Usually does well. Worst in April and September. Today she feels stopped up in her head, dry and sneezing, watery eyes, stuffy nose. Denies infection. Singulair didn't help.  Allergy shots have done well.  Had CXR/ [facility] showing incidental 2 Faria nodules at time of GB surgery last year.    Preventive Screening-Counseling & Management  Alcohol-Tobacco     Smoking Status: never  Current Medications (verified): 1)  Singulair 10 Mg  Tabs (Montelukast Sodium) .... As Needed 2)  Nasacort Aq 55 Mcg/act  Aers (Triamcinolone Acetonide(Nasal)) .... As Needed 3)  Sudafed Pe Maximum Strength 10 Mg Tabs (Phenylephrine Hcl) .... Use As Needed 4)  Multivitamins  Tabs (Multiple Vitamin) .... One Tablet Daily; 5)  Black Cohosh 40 Mg Caps (Black Cohosh) .... Take One Tablet Daily. 6)  Vitamin D3 1000 Unit Tabs  (Cholecalciferol) .... Take One Tablet Three Times A Day 7)  Allergy Vaccine .... Maintenance Dose - Gh 8)  Simvastatin 20 Mg Tabs (Simvastatin) .... Take 1 By Mouth Once Daily 9)  Mucinex 600 Mg Xr12h-Tab (Guaifenesin) .... Take 1 By Mouth Two Times A Day As Needed 10)  Allegra Allergy 180 Mg Tabs (Fexofenadine Hcl) .... Take 1 By Mouth Once Daily  Allergies (verified): 1)  ! Codeine 2)  ! Darvon 3)  ! 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   Risk Factors: Smoking Status: never (05/01/2010)  Review of Systems      See HPI       The patient complains of nasal congestion/difficulty breathing through nose, sneezing, and itching.  The patient denies shortness of breath with activity, shortness of breath at rest, productive cough, non-productive cough, coughing up blood, chest pain, irregular heartbeats, acid heartburn, indigestion, loss of appetite, weight change, abdominal pain, difficulty swallowing, sore throat, tooth/dental problems, headaches, ear ache, hand/feet swelling, rash, and fever.    Vital Signs:  Patient profile:   60 year old female Height:      61 inches O2 Sat:      96 % on Room air Pulse rate:   82 / minute BP sitting:  122 / 86  (right arm) Cuff size:   regular  Vitals Entered By: Reynaldo Minium CMA (May 01, 2010 4:18 PM)  O2 Flow:  Room air CC: Follow up visit-allergies; nasal congestion x 1 week; tried Sudafed, Mucinex, and Neti pot without relief.   Physical Exam  Additional Exam:  General: A/Ox3; pleasant and cooperative,  NAD, SKIN: no rash, lesions NODES: no lymphadenopathy HEENT: [place]/AT, EOM- WNL, Conjuctivae- clear, PERRLA, TM-turbinate edemaL, Nose- clear, Throat- clear and wnl, Mallampati  III NECK: Supple w/ fair ROM, JVD- none, normal carotid impulses w/o bruits Thyroid-  CHEST: Clear to P&A HEART: RRR, no m/g/r heard ABDOMEN: Soft and nl WNU:UVOZ, nl pulses, no edema  NEURO: Grossly intact to observation      Impression & Recommendations:  Problem # 1:  ALLERGIC RHINITIS (ICD-477.9)  Recent flair. She understands the meds well, is comfortable with allergy  vaccine, and I think we can just resume nasacort. I will give sample today of Veramyst for comparison while she is here.  Her updated medication list for this problem includes:    Nasacort Aq 55 Mcg/act Aers (Triamcinolone acetonide(nasal)) .Marland Kitchen... As needed    Sudafed Pe Maximum Strength 10 Mg Tabs (Phenylephrine hcl) ..... Use as needed    Allegra Allergy 180 Mg Tabs (Fexofenadine hcl) .Marland Kitchen... Take 1 by mouth once daily  Problem # 2:  PULMONARY NODULE (ICD-518.89) Incidental nodules seen on CT abdomen last year in this never smoker. We will get CXR for update.  Medications Added to Medication List This Visit: 1)  Simvastatin 20 Mg Tabs (Simvastatin) .... Take 1 by mouth once daily 2)  Mucinex 600 Mg Xr12h-tab (Guaifenesin) .... Take 1 by mouth two times a day as needed 3)  Allegra Allergy 180 Mg Tabs (Fexofenadine hcl) .... Take 1 by mouth once daily  Other Orders: Est. Patient Level IV (99214) T-2 View CXR (71020TC)  Patient Instructions: 1)  Flu vax 2)  refilled script for Nasacort sent to drug store 3)  A chest x-ray has been recommended.  Your imaging study may require preauthorization.  4)  Sample Veramyst nasal steroid spray as a comparison with the nasacort.      1-2 puffs each nostril once daily at bedtime. 5)  Please schedule a follow-up appointment in 1 year. Prescriptions: NASACORT AQ 55 MCG/ACT  AERS (TRIAMCINOLONE  ACETONIDE(NASAL)) as needed  #1 x prn   Entered and Authorized by:   Waymon Budge MD   Signed by:   Waymon Budge MD on 05/01/2010   Method used:   Electronically to        [facility]. [phone]* (retail)       [address]       [place], [place]  [address]       Ph: [phone]       Fax: [phone]   RxID:   [id number]

## 2010-09-24 NOTE — Letter (Signed)
Summary: Letter regarding wrong prescription/Patient  Letter regarding wrong prescription/Patient   Imported By: Sherian Rein 12/01/2009 11:40:47  _____________________________________________________________________  External Attachment:    Type:   Image     Comment:   External Document

## 2010-09-24 NOTE — Progress Notes (Signed)
Summary: rx request/ sinus-----rx for Augmentin  Phone Note Call from Patient   Caller: Patient Call For: YOUNG Summary of Call: pt c/o sinus congestion x 5 days. she had a fever fri and sat. none now. no N or V. throat is sore from drainage and has "lots of pressure" in face. has taken musinex D 12 hr and tylenol. pt works at school. call her at [phone]. [facility] [address] Initial call taken by: Tivis Ringer, CNA,  June 08, 2010 10:34 AM  Follow-up for Phone Call        [facility].  Is pt coughing? increased sob? Aundra Millet Reynolds LPN  June 08, 2010 11:09 AM   pt returned our call.  pt states Sx started approx 5 days ago.  Pt believes she has a sinus infection.  Pt c/o facial pressure, headache, nasal congestion, fever 100 on Friday and Sat, sore throat, PND.  Pt denied cough or increased sob.  Pt states she is taking Mucinex, Sudafed and Tylenol with no relief of symptoms.  Please advise.  Thanks.  Aundra Millet Reynolds LPN  June 08, 2010 11:15 AM  allergies: codeine, Darvon, Dextromethorphone  Additional Follow-up for Phone Call Additional follow up Details #1::        Per cDY-give patient Augmentin 500mg  #14 take 1 by mouth two times a day no refills.Reynaldo Minium CMA  June 08, 2010 12:11 PM     Additional Follow-up for Phone Call Additional follow up Details #2::    [facility] informing pt rx sent to pharmacy.  Aundra Millet Reynolds LPN  June 08, 2010 12:51 PM   New/Updated Medications: AUGMENTIN 500-125 MG TABS (AMOXICILLIN-POT CLAVULANATE) Take 1 tablet by mouth two times a day for 7 days Prescriptions: AUGMENTIN 500-125 MG TABS (AMOXICILLIN-POT CLAVULANATE) Take 1 tablet by mouth two times a day for 7 days  #14 x 0   Entered by:   Arman Filter LPN   Authorized by:   Waymon Budge MD   Signed by:   Arman Filter LPN on 45/40/9811   Method used:   Electronically to        [facility]. [phone]* (retail)       [address]       [place], [place]  [address]       Ph: [phone]       Fax: [phone]   RxID:   [id number]

## 2010-09-24 NOTE — Progress Notes (Signed)
Summary: reaction  Phone Note Call from Patient Call back at Work Phone [phone]   Caller: Patient Call For: young Reason for Call: Talk to Nurse Summary of Call: had a reaction to the rx called in for her yesterday.  Sinus infection - med Glynis Smiles has never taken before.  Not taking now.Marland KitchenMarland KitchenPlease respond Initial call taken by: Eugene Gavia,  November 07, 2009 8:30 AM  Follow-up for Phone Call        called, spoke with pt.  Pt states after she took 2 caps of doxy last night, she felt "severly drunk," had slurred speech, and felt "knocked out."  Pt states today her skin feels numb.  Requesting CY's recs and for abx to be changed.  Will forward to CY - please advise.  Thanks! Gweneth Dimitri RN  November 07, 2009 9:48 AM   Additional Follow-up for Phone Call Additional follow up Details #1::        per CDY, change to E-mycin 250mg  # 10 x 0 refills.  1 two times a day until gone.  Aundra Millet Reynolds LPN  November 07, 2009 9:57 AM     Additional Follow-up for Phone Call Additional follow up Details #2::    called, spoke with pt. Pt informed doxy changed to e-mycin one tablet by mouth two times a day until gone per CY and aware abx sent to [facility].  She verbalized understanding.  Gweneth Dimitri RN  November 07, 2009 10:17 AM  Follow-up by: Gweneth Dimitri RN,  November 07, 2009 10:17 AM  New/Updated Medications: ERYTHROMYCIN STEARATE 250 MG TABS (ERYTHROMYCIN STEARATE) take 1 tablet by mouth two times a day until gone Prescriptions: ERYTHROMYCIN STEARATE 250 MG TABS (ERYTHROMYCIN STEARATE) take 1 tablet by mouth two times a day until gone  #10 x 0   Entered by:   Gweneth Dimitri RN   Authorized by:   Waymon Budge MD   Signed by:   Gweneth Dimitri RN on 11/07/2009   Method used:   Electronically to        [facility]. [phone]* (retail)       [address]       [place], [place]  [address]       Ph: [phone]       Fax: [phone]   RxID:    [id number]

## 2010-09-24 NOTE — Miscellaneous (Signed)
Summary: Injection Record/[facility]  Injection Record/[facility]   Imported By: Sherian Rein 01/13/2010 11:12:31  _____________________________________________________________________  External Attachment:    Type:   Image     Comment:   External Document

## 2010-09-24 NOTE — Progress Notes (Signed)
Summary: rx req/ sinus  Phone Note Call from Patient Call back at Work Phone [phone]   Caller: Patient Call For: young Summary of Call: pt c/o sinus infection x 3 days. no cough. has drainage in throat/ throat is sore/ also has chills and feels "warm". sinus pressure in head. has taken musinex D and tylenol. requests rx called [facility] to [facility] [address]  Initial call taken by: Tivis Ringer, CNA,  October 28, 2009 9:43 AM  Follow-up for Phone Call        please advise.   allergies:  codeine, darvon, dextromethorphan.  Aundra Millet Reynolds LPN  October 29, 7562 9:45 AM   Additional Follow-up for Phone Call Additional follow up Details #1::        Per CDY- give Zpak #1 take as directed with no refills. Reynaldo Minium CMA  October 28, 2009 3:26 PM     Additional Follow-up for Phone Call Additional follow up Details #2::    called and spoke with pt.  pt aware rx sent to pharmacy. Megan Reynolds LPN  October 29, 3327 3:30 PM   New/Updated Medications: ZITHROMAX Z-PAK 250 MG TABS (AZITHROMYCIN) take as directed Prescriptions: ZITHROMAX Z-PAK 250 MG TABS (AZITHROMYCIN) take as directed  #1 x 0   Entered by:   Arman Filter LPN   Authorized by:   Waymon Budge MD   Signed by:   Arman Filter LPN on 51/88/4166   Method used:   Electronically to        [facility]. [phone]* (retail)       [address]       [place], [place]  [address]       Ph: [phone]       Fax: [phone]   RxID:   [id number]

## 2010-09-24 NOTE — Progress Notes (Signed)
Summary: meds/ speak to nurse  Phone Note Call from Patient Call back at Home Phone [phone]   Caller: Patient Call For: young Summary of Call: pt wants to speak to nurse re: doxepin that she was given and had a reaction to. (she already has meds changed-spoke to nurse previously). she thinks that maybe the wrong meds were called in wrong in the first place.  Initial call taken by: Tivis Ringer, CNA,  November 07, 2009 3:19 PM  Follow-up for Phone Call        called and spoke with pt.  pt states she called yesterday and was prescribed doxycycline  by CY (see phone note from 11-06-2009 for complete details)  However, Doxepin is what was called into pharmacy in error.  Pt states she took 2 tabs of Doxepin yesterday (per instructions on her bottle) and felt "drunk" afterwards.  (see phone note from 11-07-2009 for complete details) pt states she hasn't taken any more of this med and states the s/e of feeling "drunk" have subsided and denies any other symptoms.  I first apologized to the pt for the error and that CY and nurse manager would be aware of the incident.  Pt was ok with this.    I Informed pt not to take any more of this and that the correct medication/abx  (Erythromycin) was sent to her pharmacy today.  Pt verbalized understanding and denied any questions.  Aundra Millet Reynolds LPN  November 07, 2009 3:52 PM   Additional Follow-up for Phone Call Additional follow up Details #1::        spoke with dr young in regards to the error and he reviewed the msg and the error that was made advised me that everything would be ok as long as all symptoms had subsided and the correct med was sent for pt, Aundra Millet spoke with pt to clarify that med had been d/c'd and symptoms were completely gone and that pt did rec the new antibiotic also  pt was advised to call back  if she had any more symptoms and dr young is to be addressed with this asap. Additional Follow-up by: Philipp Deputy CMA,  November 11, 2009 11:02 AM

## 2010-09-24 NOTE — Progress Notes (Signed)
Summary: Sore throat, cough, congestion  Phone Note Call from Patient Call back at Work Phone [phone]   Caller: Patient Call For: young Reason for Call: Talk to Nurse Summary of Call: Patient calling c/o sore throat, headache, cough that keeps her up at night, congestion.  [facility] [address] Initial call taken by: Lehman Prom,  July 23, 2010 8:40 AM  Follow-up for Phone Call        Pt c/o severe sore throat, dry cough, bodyaches, "pounding" headache, weakness,clear nasal drainage and congestion since Sunday. Denies fever at this time but states did "feel warm last night". Taking Mucinex D 12hr, Afrin NS, Chestal Cough Syrup, Tylenol with minimum relief. Will be at the above work # until 4pm. Please advise Thanks! pharmacy: [facility] [address] Allergies (verified):  1)  ! Codeine 2)  ! Darvon 3)  ! * Dextromethorphan Lanette Robinson CMA  July 23, 2010 9:37 AM   Additional Follow-up for Phone Call Additional follow up Details #1::        Per CDY-give patient Zpak #1 take as directed no refills.Katie Welchel CMA  July 23, 2010 11:57 AM   Called and informed pt of CY recs pt did leave work sick. Rx sent to pharmacy pt aware. Roseana Robinson CMA  July 23, 2010 12:08 PM     New/Updated Medications: ZITHROMAX Z-PAK 250 MG TABS (AZITHROMYCIN) use as directed Prescriptions: ZITHROMAX Z-PAK 250 MG TABS (AZITHROMYCIN) use as directed  #1 x 0   Entered by:   Juniper Robinson CMA   Authorized by:   Clinton D Young MD   Signed by:   Daisja Robinson CMA on 07/23/2010   Method used:   Electronically to        [facility] [address] #[id number]* (retail)       [address]       [place], [place]  [address]       Ph: [phone]       Fax: [phone]   RxID:   [id number]

## 2010-09-24 NOTE — Miscellaneous (Signed)
Summary: Injection Record / [facility]    Injection Record / [facility]    Imported By: Lennie Odor 04/24/2010 11:34:10  _____________________________________________________________________  External Attachment:    Type:   Image     Comment:   External Document

## 2010-09-25 DIAGNOSIS — J301 Allergic rhinitis due to pollen: Secondary | ICD-10-CM

## 2010-10-02 DIAGNOSIS — J301 Allergic rhinitis due to pollen: Secondary | ICD-10-CM

## 2010-10-09 ENCOUNTER — Ambulatory Visit (INDEPENDENT_AMBULATORY_CARE_PROVIDER_SITE_OTHER): Payer: 59

## 2010-10-09 DIAGNOSIS — J301 Allergic rhinitis due to pollen: Secondary | ICD-10-CM

## 2010-10-19 ENCOUNTER — Ambulatory Visit (INDEPENDENT_AMBULATORY_CARE_PROVIDER_SITE_OTHER): Payer: 59

## 2010-10-19 ENCOUNTER — Encounter: Payer: Self-pay | Admitting: Internal Medicine

## 2010-10-19 DIAGNOSIS — J301 Allergic rhinitis due to pollen: Secondary | ICD-10-CM

## 2010-10-29 NOTE — Assessment & Plan Note (Signed)
Summary: ALLERGY/CB   Nurse Visit   Allergies: 1)  ! Codeine 2)  ! Darvon 3)  ! * Dextromethorphan  Orders Added: 1)  Allergy Injection (1) [[address]]

## 2010-11-04 ENCOUNTER — Ambulatory Visit (INDEPENDENT_AMBULATORY_CARE_PROVIDER_SITE_OTHER): Payer: 59

## 2010-11-04 ENCOUNTER — Encounter: Payer: Self-pay | Admitting: Internal Medicine

## 2010-11-04 DIAGNOSIS — J301 Allergic rhinitis due to pollen: Secondary | ICD-10-CM

## 2010-11-10 NOTE — Assessment & Plan Note (Signed)
Summary: ALLERGY/CB   Nurse Visit   Allergies: 1)  ! Codeine 2)  ! Darvon 3)  ! * Dextromethorphan  Orders Added: 1)  Allergy Injection (1) [[address]] 

## 2010-11-11 ENCOUNTER — Ambulatory Visit (INDEPENDENT_AMBULATORY_CARE_PROVIDER_SITE_OTHER): Payer: 59

## 2010-11-11 DIAGNOSIS — J301 Allergic rhinitis due to pollen: Secondary | ICD-10-CM

## 2010-11-16 ENCOUNTER — Telehealth: Payer: Self-pay | Admitting: Internal Medicine

## 2010-11-16 MED ORDER — AMOXICILLIN-POT CLAVULANATE 500-125 MG PO TABS: 1.0000 | ORAL_TABLET | Freq: Two times a day (BID) | ORAL | Status: AC

## 2010-11-16 NOTE — Telephone Encounter (Signed)
Called, spoke with pt.  She c/o HA, sore throat, PND, pressure in sinus area and ears x 8-9 days.  Has tried claritn d, advil congestion, and mucinex d with no relief.  [facility] [address].  Allergies verified.  Dr. Maple Hudson, pls advise.  Thanks!  Allergies  Allergen Reactions  . Codeine   . Darvon   . Dextromethorphan     Severe hot feeling, sweating, flushed  . Propoxyphene Hcl

## 2010-11-16 NOTE — Telephone Encounter (Signed)
Per CDY-okay to give Augmentin 500mg  #14 take 1 po bid with 1 refill suggest Netipot and Sudafed.

## 2010-11-16 NOTE — Telephone Encounter (Signed)
Spoke with pt and advised of the below recs per CDY. Pt verbalized understanding.  Rx was sent to pharm.

## 2010-11-20 ENCOUNTER — Ambulatory Visit (INDEPENDENT_AMBULATORY_CARE_PROVIDER_SITE_OTHER): Payer: 59

## 2010-11-20 DIAGNOSIS — J301 Allergic rhinitis due to pollen: Secondary | ICD-10-CM

## 2010-11-24 ENCOUNTER — Ambulatory Visit (INDEPENDENT_AMBULATORY_CARE_PROVIDER_SITE_OTHER): Payer: 59

## 2010-11-24 DIAGNOSIS — J301 Allergic rhinitis due to pollen: Secondary | ICD-10-CM

## 2010-11-30 LAB — BASIC METABOLIC PANEL
CO2: 23 mEq/L (ref 19–32)
Calcium: 9.6 mg/dL (ref 8.4–10.5)
Chloride: 107 mEq/L (ref 96–112)
GFR calc Af Amer: >60 mL/min (ref >60)
Sodium: 139 mEq/L (ref 135–145)

## 2010-11-30 LAB — CBC
Hemoglobin: 15.1 g/dL — ABNORMAL HIGH (ref 12.0–15.0)
MCHC: 34.2 g/dL (ref 30.0–36.0)
RBC: 4.82 MIL/uL (ref 3.87–5.11)
WBC: 7.6 10*3/uL (ref 4.0–10.5)

## 2010-11-30 LAB — DIFFERENTIAL
Basophils Relative: 0 % (ref 0–1)
Lymphs Abs: 2.5 10*3/uL (ref 0.7–4.0)
Monocytes Absolute: 0.5 10*3/uL (ref 0.1–1.0)
Monocytes Relative: 7 % (ref 3–12)
Neutro Abs: 4.4 10*3/uL (ref 1.7–7.7)

## 2010-12-07 ENCOUNTER — Ambulatory Visit (INDEPENDENT_AMBULATORY_CARE_PROVIDER_SITE_OTHER): Payer: 59

## 2010-12-07 DIAGNOSIS — J309 Allergic rhinitis, unspecified: Secondary | ICD-10-CM

## 2010-12-14 ENCOUNTER — Ambulatory Visit (INDEPENDENT_AMBULATORY_CARE_PROVIDER_SITE_OTHER): Payer: 59

## 2010-12-14 DIAGNOSIS — J309 Allergic rhinitis, unspecified: Secondary | ICD-10-CM

## 2010-12-21 ENCOUNTER — Ambulatory Visit (INDEPENDENT_AMBULATORY_CARE_PROVIDER_SITE_OTHER): Payer: 59

## 2010-12-21 DIAGNOSIS — J309 Allergic rhinitis, unspecified: Secondary | ICD-10-CM

## 2010-12-28 ENCOUNTER — Ambulatory Visit (INDEPENDENT_AMBULATORY_CARE_PROVIDER_SITE_OTHER): Payer: 59

## 2010-12-28 DIAGNOSIS — J309 Allergic rhinitis, unspecified: Secondary | ICD-10-CM

## 2011-01-04 ENCOUNTER — Ambulatory Visit (INDEPENDENT_AMBULATORY_CARE_PROVIDER_SITE_OTHER): Payer: 59

## 2011-01-04 DIAGNOSIS — J309 Allergic rhinitis, unspecified: Secondary | ICD-10-CM

## 2011-01-05 NOTE — Op Note (Signed)
NAME:  [name] [name], [name] [name]               ACCOUNT NO.:  [id number]   MEDICAL RECORD NO.:  [id number]          PATIENT TYPE:  AMB   LOCATION:  SDS                          FACILITY:  [facility]   PHYSICIAN:  Thomas A. Cornett, M.D.DATE OF BIRTH:  31-Aug-1950   DATE OF PROCEDURE:  02/06/2009  DATE OF DISCHARGE:  02/06/2009                               OPERATIVE REPORT   PREOPERATIVE DIAGNOSIS:  Biliary dyskinesia.   POSTOPERATIVE DIAGNOSIS:  Biliary dyskinesia.   PROCEDURE:  Laparoscopic cholecystectomy with intraoperative  cholangiogram.   SURGEON:  Thomas A. Cornett, MD   ASSISTANT:  Currie Paris, MD   ANESTHESIA:  General endotracheal anesthesia with 0.25% Sensorcaine,  Marcaine with epinephrine.   ESTIMATED BLOOD LOSS:  10 mL.   SPECIMEN:  Gallbladder to pathology.   DRAINS:  None.   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  She also has symptoms consistent with biliary dyskinesia.  She presents for laparoscopic cholecystectomy for symptomatic biliary  dyskinesia.   DESCRIPTION OF PROCEDURE:  The patient was brought to the operating room  and placed supine.  After induction of general anesthesia, the abdomen  was prepped and draped in a sterile fashion.  A 1-cm infraumbilical  incision was made through the previous scar from her tubal ligation.  Dissection was carried down her fascia and her fascia was opened in the  midline with a scalpel.  I then placed my finger in the peritoneal space  without difficulty.  Purse-string suture of 0 Vicryl was placed.  A 12-  mm Hasson cannula was placed under direct vision.  Pneumoperitoneum was  created at 15 mmHg of CO2 and laparoscope was placed.  She was then  placed in reverse Trendelenburg and rolled to her left after  insufflation of the CO2.  Laparoscopy was performed.  No evidence of  bowel injury with insertion of  this port.  Next, an 11-mm subxiphoid  port was placed and two 5-mm ports were placed in the right upper  quadrant.  The gallbladder was identified, grabbed by its dome and  pushed towards the patient's right shoulder.  It was grossly normal.  The liver, stomach, transverse colon, and Raynes bowel all appeared  grossly normal.  A second grasper was used to grab the infundibulum of  the gallbladder and pulled towards the patient's right lower quadrant.  Dissection was done around the junction of the cystic duct and  infundibulum to the cystic duct was completely exposed.  Once this was  done, a clip was placed on the gallbladder side.  A Huseby incision was  made, and through a separate stab incision, a Cook cholangiogram  catheter was introduced.  This was placed in the cystic duct and held in  place by a clip.  Intraoperative cholangiogram was then performed using  one-half strength Hypaque dye.  There was free flow of contrast down the  cystic duct and the common bile duct and into the duodenum.  There was  free flow of contrast down  the common hepatic duct into right and left  ductules.  No evidence of stone, extravasation, or stricture.  We then  removed the catheter, triple clipped the cystic duct and divided.  We  then divided the cystic artery between clips.  Cautery was used to  dissect the gallbladder from the gallbladder fossa.  The gallbladder was  somewhat intrahepatic.  A Grosso home was made in the gallbladder for  spillage of bile, but no stones were identified.  We went ahead and got  the remainder of the gallbladder out the gallbladder bed without  difficulty.  It was then placed in an Endocatch bag and pulled out the  umbilicus under direct vision.  The umbilical port site was closed with  Vicryl under direct vision.  We irrigated and suctioned out any excess  bile or fluid until clear.  The gallbladder bed was hemostatic with no  signs of bile leakage or spillage or bile  draining from the liver bed.  Clips were on the cystic duct and cystic artery respectively.  After all  the irrigation was suctioned out, we removed our ports allowing the CO2  to escape without any signs of port site bleeding.  Of note, there  appeared to be no injury to the internal viscera.  Once CO2 was allowed  to escape, all ports were passed off the field.  We closed the incision  with 4-0 Monocryl and Dermabond.  All final counts of sponge, needle,  and instrument were found to be correct in this portion of case.  The  patient was then extubated and taken to the recovery room in  satisfactory condition.      Thomas A. Cornett, M.D.  Electronically Signed     TAC/MEDQ  D:  02/06/2009  T:  02/06/2009  Job:  [id number]   cc:   Pam Drown, M.D.

## 2011-01-05 NOTE — Assessment & Plan Note (Signed)
[facility]                             PULMONARY OFFICE NOTE   NAME:Trager, Elesia                        MRN:          [id number]  DATE:07/07/2007                            DOB:          08-26-1950    ALLERGY FOLLOW-UP:   PROBLEM:  Allergic rhinitis.   HISTORY:  She says she is doing extremely well on current management and  wants no changes.  Singulair was no help.  Mucinex D helps but upsets  her stomach.  Continues allergy vaccine at 1 to 10 with no problems.  Has had flu vaccine.  She stopped using Rhinocort but uses Nasacort for  intervals appropriately p.r.n.   OBJECTIVE:  VITAL SIGNS:  Weight 165 pounds, blood pressure 125/64,  pulse 97, room air saturation 96%.  HEENT:  There is mild nasal turbinate edema.  No significant mucus.  No  erosion or blood.  No periorbital edema or conjunctival injection.  Her  pharynx is clear.  LUNGS:  Clear.  CARDIOVASCULAR:  Heart sounds normal.   IMPRESSION:  Allergic rhinitis.   PLAN:  Instead of Mucinex D, leave off the Guaifenesin component which  may be causing nausea and try Sudafed-PE p.r.n.  We have discussed nasal  saline and environmental precautions.  Schedule return one year, earlier  p.r.n.     Clinton D. Maple Hudson, MD, Tonny Bollman, FACP  Electronically Signed    CDY/MedQ  DD: 07/07/2007  DT: 07/09/2007  Job #: [id number]   cc:   C. Duane Lope, M.D.

## 2011-01-08 NOTE — Assessment & Plan Note (Signed)
[facility]                               PULMONARY OFFICE NOTE   NAME:[name], [name]                        MRN:          [id number]  DATE:06/14/2006                            DOB:          1951-02-04    PROBLEM:  Allergic rhinitis.   HISTORY:  She finds that she needs to stay on Mucinex D 12-hours b.i.d. on a  regular schedule otherwise she gets nasal congestion. Over the years, she  has had big improvement and almost never has sinus headaches anymore. She  credits this to her allergy vaccine which is given here at 1-50 with no  problems. There has been nothing purulent or bloody.   MEDICATIONS:  1. Allergy vaccine.  2. Mucinex D.  3. Multivitamins.  4. Singulair.   DRUG INTOLERANCE:  1. CODEINE.  2. DARVON.  3. DEXTROMETHORPHAN.   OBJECTIVE:  Weight: 160 pounds. Blood pressure: 136/80. Pulse: Regular, 85.  Room air saturation 98%.  There is peri-orbital puffiness and mild turbinate edema. No mucus bridging  or polyps. Her pharynx looks clear without post-nasal drip. There is no  adenopathy or stridor.  LUNGS:  Clear.  Heart sounds normal.   IMPRESSION:  Perennial allergic rhinitis.   PLAN:  1. Flu vaccine.  2. Refill prescription: Singular 10 mg.  3. Next order we will advance her allergy vaccine to 1-10 as discussed.  4. Schedule return one year, earlier p.r.n.       Clinton D. Maple Hudson, MD, FCCP, FACP   CDY/MedQ DD:  06/14/2006 DT:  06/15/2006 Job #:  [id number]

## 2011-01-12 ENCOUNTER — Ambulatory Visit (INDEPENDENT_AMBULATORY_CARE_PROVIDER_SITE_OTHER): Payer: 59

## 2011-01-12 DIAGNOSIS — J309 Allergic rhinitis, unspecified: Secondary | ICD-10-CM

## 2011-01-20 ENCOUNTER — Ambulatory Visit (INDEPENDENT_AMBULATORY_CARE_PROVIDER_SITE_OTHER): Payer: 59

## 2011-01-20 DIAGNOSIS — J309 Allergic rhinitis, unspecified: Secondary | ICD-10-CM

## 2011-01-29 ENCOUNTER — Ambulatory Visit (INDEPENDENT_AMBULATORY_CARE_PROVIDER_SITE_OTHER): Payer: 59

## 2011-01-29 DIAGNOSIS — J309 Allergic rhinitis, unspecified: Secondary | ICD-10-CM

## 2011-02-15 ENCOUNTER — Ambulatory Visit (INDEPENDENT_AMBULATORY_CARE_PROVIDER_SITE_OTHER): Payer: 59

## 2011-02-15 DIAGNOSIS — J309 Allergic rhinitis, unspecified: Secondary | ICD-10-CM

## 2011-02-23 ENCOUNTER — Ambulatory Visit (INDEPENDENT_AMBULATORY_CARE_PROVIDER_SITE_OTHER): Payer: 59

## 2011-02-23 DIAGNOSIS — J309 Allergic rhinitis, unspecified: Secondary | ICD-10-CM

## 2011-03-09 ENCOUNTER — Ambulatory Visit (INDEPENDENT_AMBULATORY_CARE_PROVIDER_SITE_OTHER): Payer: 59

## 2011-03-09 DIAGNOSIS — J309 Allergic rhinitis, unspecified: Secondary | ICD-10-CM

## 2011-03-22 ENCOUNTER — Ambulatory Visit (INDEPENDENT_AMBULATORY_CARE_PROVIDER_SITE_OTHER): Payer: 59

## 2011-03-22 DIAGNOSIS — J309 Allergic rhinitis, unspecified: Secondary | ICD-10-CM

## 2011-04-01 ENCOUNTER — Ambulatory Visit (INDEPENDENT_AMBULATORY_CARE_PROVIDER_SITE_OTHER): Payer: 59

## 2011-04-01 DIAGNOSIS — J309 Allergic rhinitis, unspecified: Secondary | ICD-10-CM

## 2011-04-07 ENCOUNTER — Encounter: Payer: Self-pay | Admitting: Internal Medicine

## 2011-04-09 ENCOUNTER — Ambulatory Visit (INDEPENDENT_AMBULATORY_CARE_PROVIDER_SITE_OTHER): Payer: 59

## 2011-04-09 DIAGNOSIS — J309 Allergic rhinitis, unspecified: Secondary | ICD-10-CM

## 2011-04-16 ENCOUNTER — Ambulatory Visit (INDEPENDENT_AMBULATORY_CARE_PROVIDER_SITE_OTHER): Payer: 59

## 2011-04-16 DIAGNOSIS — J309 Allergic rhinitis, unspecified: Secondary | ICD-10-CM

## 2011-04-20 ENCOUNTER — Telehealth: Payer: Self-pay | Admitting: Internal Medicine

## 2011-04-20 MED ORDER — AMOXICILLIN-POT CLAVULANATE 875-125 MG PO TABS: 1.0000 | ORAL_TABLET | Freq: Two times a day (BID) | ORAL | Status: AC

## 2011-04-20 MED ORDER — PREDNISONE 20 MG PO TABS: 20.0000 mg | ORAL_TABLET | Freq: Every day | ORAL | Status: AC

## 2011-04-20 NOTE — Telephone Encounter (Signed)
Spoke with pt and she c/o severe post nasal drip. Pain up into the right ear, pounding headache, ears feels stopped up x 12 days. Pt states she has been taking mucinex 600 1 bid, sudafed 1 every 4 hours, and advil 2 every 4 hours and has had no relief. Pt has an upcoming OV 04/30/11 Please advise Dr. Maple Hudson. Thanks  Allergies  Allergen Reactions  . Codeine   . Darvon   . Dextromethorphan     Severe hot feeling, sweating, flushed  . Propoxyphene Hcl     Carver Fila, CMA

## 2011-04-20 NOTE — Telephone Encounter (Signed)
Per CY-suggest prednisone 20 mg daily x 5 days # 5 no refills and Augmentin 875 mg #14 take 1 po bid no refills.

## 2011-04-20 NOTE — Telephone Encounter (Signed)
Medications sent to pharmacy. [id number] to advise the pt on home number. Carron Curie, CMA

## 2011-04-21 NOTE — Telephone Encounter (Signed)
Pt aware rx's has been sent.

## 2011-04-23 ENCOUNTER — Ambulatory Visit (INDEPENDENT_AMBULATORY_CARE_PROVIDER_SITE_OTHER): Payer: 59

## 2011-04-23 DIAGNOSIS — J309 Allergic rhinitis, unspecified: Secondary | ICD-10-CM

## 2011-04-29 ENCOUNTER — Encounter: Payer: Self-pay | Admitting: Internal Medicine

## 2011-04-30 ENCOUNTER — Ambulatory Visit (INDEPENDENT_AMBULATORY_CARE_PROVIDER_SITE_OTHER): Payer: 59

## 2011-04-30 ENCOUNTER — Ambulatory Visit: Payer: Self-pay | Admitting: Internal Medicine

## 2011-04-30 DIAGNOSIS — J309 Allergic rhinitis, unspecified: Secondary | ICD-10-CM

## 2011-05-07 ENCOUNTER — Ambulatory Visit (INDEPENDENT_AMBULATORY_CARE_PROVIDER_SITE_OTHER): Payer: 59

## 2011-05-07 DIAGNOSIS — J309 Allergic rhinitis, unspecified: Secondary | ICD-10-CM

## 2011-05-13 ENCOUNTER — Encounter: Payer: Self-pay | Admitting: Internal Medicine

## 2011-05-13 ENCOUNTER — Ambulatory Visit (INDEPENDENT_AMBULATORY_CARE_PROVIDER_SITE_OTHER): Payer: 59 | Admitting: Internal Medicine

## 2011-05-13 ENCOUNTER — Ambulatory Visit (INDEPENDENT_AMBULATORY_CARE_PROVIDER_SITE_OTHER): Payer: 59

## 2011-05-13 VITALS — BP 128/80 | HR 87 | Ht 61.0 in

## 2011-05-13 DIAGNOSIS — J301 Allergic rhinitis due to pollen: Secondary | ICD-10-CM

## 2011-05-13 DIAGNOSIS — J984 Other disorders of lung: Secondary | ICD-10-CM

## 2011-05-13 DIAGNOSIS — J309 Allergic rhinitis, unspecified: Secondary | ICD-10-CM

## 2011-05-13 MED ORDER — MOMETASONE FUROATE 50 MCG/ACT NA SUSP: 2.0000 | Freq: Every day | NASAL | Status: DC

## 2011-05-13 NOTE — Patient Instructions (Addendum)
Sample / script nasonex-   2 sprays each nostril once daily at bedtime. This is a gradual, cumulative anti-inflammatory steroid.   Continue allergy vaccine, sudafed and mucinex as you are doing  Order-PCC schedule noncontrast CT chest- dx lung nodules

## 2011-05-13 NOTE — Progress Notes (Signed)
  Subjective:    Patient ID: [name] [name], female    DOB: 08/19/1951, 60 y.o.   MRN: [id number]  HPI 05/13/11- 60 year old female never smoker followed for allergic rhinitis, lung nodules on chest x-ray/2010 Last here -05/01/2010 Wants to wait on flu shot. Has some nasal congestion. Was treated for sinusitis in August with Sudafed and Mucinex. We gave Augmentin and 5 days of prednisone. She feels nothing to blow out of her nose now. Chest is clear. Denies headache chest pain fever sore throat or nodes. Continues allergy vaccine.  Review of Systems Constitutional:   No-   weight loss, night sweats, fevers, chills, fatigue, lassitude. HEENT:   No-  headaches, difficulty swallowing, tooth/dental problems, sore throat,       No-  sneezing, itching, ear ache, +nasal congestion, post nasal drip,  CV:  No-   chest pain, orthopnea, PND, swelling in lower extremities, anasarca, dizziness, palpitations Resp: No-   shortness of breath with exertion or at rest.              No-   productive cough,  No non-productive cough,  No-  coughing up of blood.              No-   change in color of mucus.  No- wheezing.   Skin: No-   rash or lesions. GI:  No-   heartburn, indigestion, abdominal pain, nausea, vomiting, diarrhea,                 change in bowel habits, loss of appetite GU: No-   dysuria, change in color of urine, no urgency or frequency.  No- flank pain. MS:  No-   joint pain or swelling.  No- decreased range of motion.  No- back pain. Neuro- grossly normal  Psych:  No- change in mood or affect. No depression or anxiety.  No memory loss.      Objective:   Physical Exam General- Alert, Oriented, Affect-appropriate, Distress- none acute Skin- rash-none, lesions- none, excoriation- none. Fair complected, looks flushed, question rosacea. Lymphadenopathy- none Head- atraumatic            Eyes- Gross vision intact, PERRLA, conjunctivae clear secretions            Ears- Hearing, canals-normal       Nose- Clear, no-Septal dev, mucus, polyps, erosion, perforation             Throat- Mallampati II , mucosa clear , drainage- none, tonsils- atrophic Neck- flexible , trachea midline, no stridor , thyroid nl, carotid no bruit Chest - symmetrical excursion , unlabored           Heart/CV- RRR , no murmur , no gallop  , no rub, nl s1 s2                           - JVD- none , edema- none, stasis changes- none, varices- none           Lung- clear to P&A, wheeze- none, cough- none , dullness-none, rub- none           Chest wall-  Abd- tender-no, distended-no, bowel sounds-present, HSM- no Br/ Gen/ Rectal- Not done, not indicated Extrem- cyanosis- none, clubbing, none, atrophy- none, strength- nl Neuro- grossly intact to observation         Assessment & Plan:

## 2011-05-16 NOTE — Assessment & Plan Note (Addendum)
Discussed followup CT scan

## 2011-05-16 NOTE — Assessment & Plan Note (Signed)
Persistent sense of congestion with a little on exam now, after treatment with antibiotic and prednisone. Plan-add Nasonex, continue allergy vaccine ; Discussed saline lavage and deongestants.

## 2011-05-18 ENCOUNTER — Telehealth: Payer: Self-pay | Admitting: *Deleted

## 2011-05-18 DIAGNOSIS — R911 Solitary pulmonary nodule: Secondary | ICD-10-CM

## 2011-05-18 NOTE — Telephone Encounter (Signed)
Message copied by Ronny Bacon on Tue May 18, 2011  3:37 PM ------      Message from: [place], [place] D      Created: Sun May 16, 2011  8:16 PM       At last OV on September 20, we discussed her lung nodules seen on CT scan 2010. We closed without arranging followup. I have reviewed the 2010 study and note the radiologist recommended a followup CT scan so I think we had better get that done. Please contact her about this and arrange for a noncontrast CT chest for diagnosis of lung nodules. Thanks

## 2011-05-18 NOTE — Telephone Encounter (Signed)
Pt aware and agrees to have CT Chest done for lung nodules seen in 02-2009;order placed.

## 2011-05-21 ENCOUNTER — Ambulatory Visit (INDEPENDENT_AMBULATORY_CARE_PROVIDER_SITE_OTHER)
Admission: RE | Admit: 2011-05-21 | Discharge: 2011-05-21 | Disposition: A | Discharge status: Home / Self Care | Payer: 59 | Source: Ambulatory Visit | Attending: Internal Medicine | Admitting: Internal Medicine

## 2011-05-21 ENCOUNTER — Ambulatory Visit (INDEPENDENT_AMBULATORY_CARE_PROVIDER_SITE_OTHER): Payer: 59

## 2011-05-21 DIAGNOSIS — R911 Solitary pulmonary nodule: Secondary | ICD-10-CM

## 2011-05-21 DIAGNOSIS — J309 Allergic rhinitis, unspecified: Secondary | ICD-10-CM

## 2011-05-21 DIAGNOSIS — J984 Other disorders of lung: Secondary | ICD-10-CM

## 2011-05-28 NOTE — Progress Notes (Signed)
Quick Note:  Pt aware of results. ______ 

## 2011-06-03 ENCOUNTER — Ambulatory Visit (INDEPENDENT_AMBULATORY_CARE_PROVIDER_SITE_OTHER): Payer: 59

## 2011-06-03 DIAGNOSIS — J309 Allergic rhinitis, unspecified: Secondary | ICD-10-CM

## 2011-06-03 DIAGNOSIS — Z23 Encounter for immunization: Secondary | ICD-10-CM

## 2011-06-11 ENCOUNTER — Ambulatory Visit (INDEPENDENT_AMBULATORY_CARE_PROVIDER_SITE_OTHER): Payer: 59

## 2011-06-11 DIAGNOSIS — J309 Allergic rhinitis, unspecified: Secondary | ICD-10-CM

## 2011-06-14 ENCOUNTER — Ambulatory Visit (INDEPENDENT_AMBULATORY_CARE_PROVIDER_SITE_OTHER): Payer: 59

## 2011-06-14 DIAGNOSIS — J309 Allergic rhinitis, unspecified: Secondary | ICD-10-CM

## 2011-06-18 ENCOUNTER — Ambulatory Visit (INDEPENDENT_AMBULATORY_CARE_PROVIDER_SITE_OTHER): Payer: 59

## 2011-06-18 DIAGNOSIS — J309 Allergic rhinitis, unspecified: Secondary | ICD-10-CM

## 2011-06-25 ENCOUNTER — Ambulatory Visit (INDEPENDENT_AMBULATORY_CARE_PROVIDER_SITE_OTHER): Payer: 59

## 2011-06-25 DIAGNOSIS — J309 Allergic rhinitis, unspecified: Secondary | ICD-10-CM

## 2011-07-02 ENCOUNTER — Ambulatory Visit (INDEPENDENT_AMBULATORY_CARE_PROVIDER_SITE_OTHER): Payer: 59

## 2011-07-02 DIAGNOSIS — J309 Allergic rhinitis, unspecified: Secondary | ICD-10-CM

## 2011-07-12 ENCOUNTER — Ambulatory Visit (INDEPENDENT_AMBULATORY_CARE_PROVIDER_SITE_OTHER): Payer: 59

## 2011-07-12 DIAGNOSIS — J309 Allergic rhinitis, unspecified: Secondary | ICD-10-CM

## 2011-07-19 ENCOUNTER — Encounter: Payer: Self-pay | Admitting: Internal Medicine

## 2011-07-23 ENCOUNTER — Ambulatory Visit (INDEPENDENT_AMBULATORY_CARE_PROVIDER_SITE_OTHER): Payer: 59

## 2011-07-23 DIAGNOSIS — J309 Allergic rhinitis, unspecified: Secondary | ICD-10-CM

## 2011-08-03 ENCOUNTER — Telehealth: Payer: Self-pay | Admitting: Internal Medicine

## 2011-08-03 MED ORDER — CEFDINIR 300 MG PO CAPS: 300.0000 mg | ORAL_CAPSULE | Freq: Two times a day (BID) | ORAL | Status: AC

## 2011-08-03 NOTE — Telephone Encounter (Signed)
Spoke with pt. She is c/o "sinus infection"- onset 12/6, started to have chills, sore throat, HA and sinus pressure. Unable to produce any mucus at all. She states takes mucinex D and sudafed in the early am and this helps only for a short time. Would like something called in. Please advise, thanks! Allergies  Allergen Reactions  . Dextromethorphan Other (See Comments)    Severe hot feeling, sweating, flushed,incoherent  . Codeine   . Darvon   . Propoxyphene Hcl

## 2011-08-03 NOTE — Telephone Encounter (Signed)
Per CY-give Cefdinir 300mg  #14 take 1 po bid no refills.

## 2011-08-03 NOTE — Telephone Encounter (Signed)
Spoke with pt and notified of recs per CDY Pt verbalized understanding and states nothing further needed Rx was sent to pharm 

## 2011-08-06 ENCOUNTER — Ambulatory Visit (INDEPENDENT_AMBULATORY_CARE_PROVIDER_SITE_OTHER): Payer: 59

## 2011-08-06 DIAGNOSIS — J309 Allergic rhinitis, unspecified: Secondary | ICD-10-CM

## 2011-08-12 ENCOUNTER — Ambulatory Visit (INDEPENDENT_AMBULATORY_CARE_PROVIDER_SITE_OTHER): Payer: 59

## 2011-08-12 DIAGNOSIS — J309 Allergic rhinitis, unspecified: Secondary | ICD-10-CM

## 2011-08-20 ENCOUNTER — Ambulatory Visit (INDEPENDENT_AMBULATORY_CARE_PROVIDER_SITE_OTHER): Payer: 59

## 2011-08-20 DIAGNOSIS — J309 Allergic rhinitis, unspecified: Secondary | ICD-10-CM

## 2011-08-27 ENCOUNTER — Ambulatory Visit (INDEPENDENT_AMBULATORY_CARE_PROVIDER_SITE_OTHER): Payer: 59

## 2011-08-27 DIAGNOSIS — J309 Allergic rhinitis, unspecified: Secondary | ICD-10-CM

## 2011-09-02 ENCOUNTER — Telehealth: Payer: Self-pay | Admitting: Internal Medicine

## 2011-09-02 MED ORDER — AMOXICILLIN-POT CLAVULANATE 500-125 MG PO TABS: 1.0000 | ORAL_TABLET | Freq: Two times a day (BID) | ORAL | Status: AC

## 2011-09-02 NOTE — Telephone Encounter (Signed)
Per CY-okay to give Augmentin 500 mg #14 take 1 po bid no refills; patient is aware that Rx has been sent to [facility].

## 2011-09-02 NOTE — Telephone Encounter (Signed)
Called spoke with patient who c/o HA, chills, "terrible" sore throat, head congestion, clear nasal drainage, PND, "white streaks" in back of throat, throat is raw onset yesterday.  Has been using sudafed, plain mucinex, netti pot, saline nasal spray, tylenol/advil.  [employer].  Last ov 9.20.12.  Allergies  Allergen Reactions  . Dextromethorphan Other (See Comments)    Severe hot feeling, sweating, flushed,incoherent  . Codeine   . Darvon   . Propoxyphene Hcl    Dr Maple Hudson please advise, thanks.  *pt stated she may go home so may need to reach her at her number when calling back.

## 2011-09-09 ENCOUNTER — Ambulatory Visit (INDEPENDENT_AMBULATORY_CARE_PROVIDER_SITE_OTHER): Payer: 59

## 2011-09-09 DIAGNOSIS — J309 Allergic rhinitis, unspecified: Secondary | ICD-10-CM

## 2011-09-16 ENCOUNTER — Ambulatory Visit (INDEPENDENT_AMBULATORY_CARE_PROVIDER_SITE_OTHER): Payer: 59

## 2011-09-16 DIAGNOSIS — J309 Allergic rhinitis, unspecified: Secondary | ICD-10-CM

## 2011-09-24 ENCOUNTER — Ambulatory Visit (INDEPENDENT_AMBULATORY_CARE_PROVIDER_SITE_OTHER): Payer: 59

## 2011-09-24 DIAGNOSIS — J309 Allergic rhinitis, unspecified: Secondary | ICD-10-CM

## 2011-09-29 ENCOUNTER — Ambulatory Visit (INDEPENDENT_AMBULATORY_CARE_PROVIDER_SITE_OTHER): Payer: 59

## 2011-09-29 DIAGNOSIS — J309 Allergic rhinitis, unspecified: Secondary | ICD-10-CM

## 2011-10-08 ENCOUNTER — Ambulatory Visit (INDEPENDENT_AMBULATORY_CARE_PROVIDER_SITE_OTHER): Payer: 59

## 2011-10-08 DIAGNOSIS — J309 Allergic rhinitis, unspecified: Secondary | ICD-10-CM

## 2011-10-18 ENCOUNTER — Telehealth: Payer: Self-pay | Admitting: Internal Medicine

## 2011-10-18 MED ORDER — DOXYCYCLINE HYCLATE 100 MG PO TABS: ORAL_TABLET | ORAL | Status: DC

## 2011-10-18 NOTE — Telephone Encounter (Signed)
Per  CY  Doxycycline 100 mg #8  Take 2 tablets today then 1 daily till gone  0 refills Pt aware and rx sent to [facility] on [address]

## 2011-10-18 NOTE — Telephone Encounter (Signed)
Pt c/o sinus congestion, pressure in ears and eyes, sinus drainage (brown- clear).  Is taking Mucinex D, Tylenol and using Neti pot.  Please advise. Allergies  Allergen Reactions  . Dextromethorphan Other (See Comments)    Severe hot feeling, sweating, flushed,incoherent  . Codeine   . Darvon   . Propoxyphene Hcl

## 2011-10-19 ENCOUNTER — Ambulatory Visit (INDEPENDENT_AMBULATORY_CARE_PROVIDER_SITE_OTHER): Payer: 59

## 2011-10-19 DIAGNOSIS — J309 Allergic rhinitis, unspecified: Secondary | ICD-10-CM

## 2011-10-27 ENCOUNTER — Telehealth: Payer: Self-pay | Admitting: Internal Medicine

## 2011-10-27 MED ORDER — ACRIVASTINE-PSEUDOEPHEDRINE 8-60 MG PO CAPS: 1.0000 | ORAL_CAPSULE | Freq: Two times a day (BID) | ORAL | Status: DC | PRN

## 2011-10-27 NOTE — Telephone Encounter (Signed)
Spoke with pt and notified of recs per CDY She verbalized understanding and rx was sent to pharm  

## 2011-10-27 NOTE — Telephone Encounter (Signed)
Try Rx Semprex-D  # 10, 1 twice daily as needed- antihistamine decongestant combination

## 2011-10-27 NOTE — Telephone Encounter (Signed)
I spoke with the pt. On 08-03-11 she called in with sinus infection and was given cefdinir, 09-02-11 called for same and was given augmentin, and then on 10-18-11 called for same and was given doxycycline. She states she does not feel she has an infection any longer but she still has a lot of sinus congestion and ear pressure. She states she is blowing mucus from her nose, but it is clear now. She also states she still is very fatigued. She is alternating mucinex and sudafed-d, and is doing netti pot daily without relief. The pt is asking should she try an antihistamine, or what other recs do you have? Please advise. Carron Curie, CMA Allergies  Allergen Reactions  . Dextromethorphan Other (See Comments)    Severe hot feeling, sweating, flushed,incoherent  . Codeine   . Darvon   . Propoxyphene Hcl

## 2011-10-28 ENCOUNTER — Telehealth: Payer: Self-pay | Admitting: Internal Medicine

## 2011-10-28 NOTE — Telephone Encounter (Signed)
Called spoke with patient who reported that she went to the pharmacy yesterday to pick up the semprex d but will have to be ordered.  Pt is requesting appt stating that she "just doesn't feel right" with continued sinus congestion, weakness and fatigue.  CDY with opening 3.8.13 @ 0945 > appt scheduled.  Pt aware if symptoms worsen overnight to seek emergent help.    Denied f/c/s, body aches.

## 2011-10-29 ENCOUNTER — Encounter: Payer: Self-pay | Admitting: Internal Medicine

## 2011-10-29 ENCOUNTER — Ambulatory Visit (INDEPENDENT_AMBULATORY_CARE_PROVIDER_SITE_OTHER): Payer: 59 | Admitting: Internal Medicine

## 2011-10-29 ENCOUNTER — Ambulatory Visit (INDEPENDENT_AMBULATORY_CARE_PROVIDER_SITE_OTHER): Payer: 59

## 2011-10-29 VITALS — BP 122/66 | HR 103 | Ht 61.0 in | Wt 159.0 lb

## 2011-10-29 DIAGNOSIS — J309 Allergic rhinitis, unspecified: Secondary | ICD-10-CM

## 2011-10-29 DIAGNOSIS — J984 Other disorders of lung: Secondary | ICD-10-CM

## 2011-10-29 DIAGNOSIS — J301 Allergic rhinitis due to pollen: Secondary | ICD-10-CM

## 2011-10-29 MED ORDER — AZITHROMYCIN 250 MG PO TABS: ORAL_TABLET | ORAL | Status: AC

## 2011-10-29 MED ORDER — PHENYLEPHRINE HCL 1 % NA SOLN: 1.0000 [drp] | Freq: Once | NASAL | Status: DC

## 2011-10-29 NOTE — Patient Instructions (Signed)
Neb neo nasal  Depo 80  Script Z pak printed

## 2011-10-29 NOTE — Progress Notes (Signed)
Patient ID: [name] [name], female    DOB: 06-25-1951, 61 y.o.   MRN: [id number]  HPI 05/13/11- 61 year old female never smoker followed for allergic rhinitis, lung nodules on chest x-ray/2010 Last here -05/01/2010 Wants to wait on flu shot. Has some nasal congestion. Was treated for sinusitis in August with Sudafed and Mucinex. We gave Augmentin and 5 days of prednisone. She feels nothing to blow out of her nose now. Chest is clear. Denies headache chest pain fever sore throat or nodes. Continues allergy vaccine.  10/29/11- 61 year old female never smoker followed for allergic rhinitis, lung nodules on chest x-ray/2010 Acute visit-sinus congestion as well as chest congestion; cough-prod-not much color; slight sneezing but rare-able to blow nose and no color. For 2 weeks has had head congestion extending into chest. Initially sore throat, now gone. Mucus clear with some brown. Feels stopped up. Chest tight without wheeze or cough. Taking Mucinex D each morning, Sudafed each evening.   CXR 05/18/11  Images reviewed w/ her-    IMPRESSION:  1. Stable 5 mm right upper lobe nodule, consistent with benign  etiology.  2. No active disease or other significant abnormality identified.  Original Report Authenticated By: Danae Orleans, M.D.     Review of Systems-see HPI Constitutional:   No-   weight loss, night sweats, fevers, chills, fatigue, lassitude. HEENT:   No-  headaches, difficulty swallowing, tooth/dental problems, sore throat,       No-  sneezing, itching, ear ache, +nasal congestion, post nasal drip,  CV:  No-   chest pain, orthopnea, PND, swelling in lower extremities, anasarca, dizziness, palpitations Resp: No-   shortness of breath with exertion or at rest.              No-   productive cough,  No non-productive cough,  No-  coughing up of blood.              No-   change in color of mucus.  No- wheezing.   Skin: No-   rash or lesions. GI:  No-   heartburn, indigestion, abdominal pain,  nausea, vomiting,  GU: No-   dysuria,  MS:  No-   joint pain or swelling.  . Neuro- grossly normal  Psych:  No- change in mood or affect. No depression or anxiety.  No memory loss.     Objective:   Physical Exam General- Alert, Oriented, Affect-appropriate, Distress- none acute Skin- rash-none, lesions- none, excoriation- none. Fair complected, looks flushed, question rosacea. Lymphadenopathy- none Head- atraumatic            Eyes- Gross vision intact, PERRLA, conjunctivae clear secretions            Ears- Hearing, canals-normal            Nose- turbinate edema no-Septal dev, mucus, polyps, erosion, perforation             Throat- Mallampati II , mucosa clear , drainage- none, tonsils- atrophic Neck- flexible , trachea midline, no stridor , thyroid nl, carotid no bruit Chest - symmetrical excursion , unlabored           Heart/CV- RRR , no murmur , no gallop  , no rub, nl s1 s2                           - JVD- none , edema- none, stasis changes- none, varices- none  Lung- clear to P&A, wheeze- none, cough- none , dullness-none, rub- none           Chest wall-  Abd-  Br/ Gen/ Rectal- Not done, not indicated Extrem- cyanosis- none, clubbing, none, atrophy- none, strength- nl Neuro- grossly intact to observation       

## 2011-11-02 DIAGNOSIS — J309 Allergic rhinitis, unspecified: Secondary | ICD-10-CM

## 2011-11-02 MED ORDER — METHYLPREDNISOLONE ACETATE 80 MG/ML IJ SUSP
80.0000 mg | Freq: Once | INTRAMUSCULAR | Status: AC
  Administered 2011-11-02: 80 mg via INTRAMUSCULAR

## 2011-11-02 MED ORDER — PHENYLEPHRINE HCL 1 % NA SOLN
3.0000 [drp] | Freq: Once | NASAL | Status: AC
  Administered 2011-11-02: 3 [drp] via NASAL

## 2011-11-02 NOTE — Assessment & Plan Note (Signed)
Increased nasal congestion acutely, most consistent with an upper respiratory infection and bronchitis. Can't exclude an early sinusitis. Plan nasal decongestant nebulizer, Depo-Medrol, Z-Pak, fluids. Continue decongestants.

## 2011-11-05 ENCOUNTER — Ambulatory Visit (INDEPENDENT_AMBULATORY_CARE_PROVIDER_SITE_OTHER): Payer: 59

## 2011-11-05 DIAGNOSIS — J309 Allergic rhinitis, unspecified: Secondary | ICD-10-CM

## 2011-11-09 ENCOUNTER — Encounter: Payer: Self-pay | Admitting: Internal Medicine

## 2011-11-09 ENCOUNTER — Ambulatory Visit (INDEPENDENT_AMBULATORY_CARE_PROVIDER_SITE_OTHER): Payer: 59

## 2011-11-09 DIAGNOSIS — J309 Allergic rhinitis, unspecified: Secondary | ICD-10-CM

## 2011-11-15 ENCOUNTER — Ambulatory Visit (INDEPENDENT_AMBULATORY_CARE_PROVIDER_SITE_OTHER): Payer: 59

## 2011-11-15 DIAGNOSIS — J309 Allergic rhinitis, unspecified: Secondary | ICD-10-CM

## 2011-12-01 ENCOUNTER — Ambulatory Visit (INDEPENDENT_AMBULATORY_CARE_PROVIDER_SITE_OTHER): Payer: 59

## 2011-12-01 DIAGNOSIS — J309 Allergic rhinitis, unspecified: Secondary | ICD-10-CM

## 2011-12-09 ENCOUNTER — Ambulatory Visit (INDEPENDENT_AMBULATORY_CARE_PROVIDER_SITE_OTHER): Payer: 59

## 2011-12-09 DIAGNOSIS — J309 Allergic rhinitis, unspecified: Secondary | ICD-10-CM

## 2011-12-15 ENCOUNTER — Ambulatory Visit (INDEPENDENT_AMBULATORY_CARE_PROVIDER_SITE_OTHER): Payer: 59

## 2011-12-15 DIAGNOSIS — J309 Allergic rhinitis, unspecified: Secondary | ICD-10-CM

## 2011-12-22 ENCOUNTER — Ambulatory Visit (INDEPENDENT_AMBULATORY_CARE_PROVIDER_SITE_OTHER): Payer: 59

## 2011-12-22 DIAGNOSIS — J309 Allergic rhinitis, unspecified: Secondary | ICD-10-CM

## 2011-12-29 ENCOUNTER — Ambulatory Visit (INDEPENDENT_AMBULATORY_CARE_PROVIDER_SITE_OTHER): Payer: BC Managed Care – PPO

## 2011-12-29 DIAGNOSIS — J309 Allergic rhinitis, unspecified: Secondary | ICD-10-CM

## 2012-01-07 ENCOUNTER — Ambulatory Visit (INDEPENDENT_AMBULATORY_CARE_PROVIDER_SITE_OTHER): Payer: BC Managed Care – PPO

## 2012-01-07 DIAGNOSIS — J309 Allergic rhinitis, unspecified: Secondary | ICD-10-CM

## 2012-01-14 ENCOUNTER — Ambulatory Visit (INDEPENDENT_AMBULATORY_CARE_PROVIDER_SITE_OTHER): Payer: BC Managed Care – PPO

## 2012-01-14 DIAGNOSIS — J309 Allergic rhinitis, unspecified: Secondary | ICD-10-CM

## 2012-01-21 ENCOUNTER — Ambulatory Visit (INDEPENDENT_AMBULATORY_CARE_PROVIDER_SITE_OTHER): Payer: BC Managed Care – PPO

## 2012-01-21 DIAGNOSIS — J309 Allergic rhinitis, unspecified: Secondary | ICD-10-CM

## 2012-01-27 ENCOUNTER — Ambulatory Visit (INDEPENDENT_AMBULATORY_CARE_PROVIDER_SITE_OTHER): Payer: BC Managed Care – PPO

## 2012-01-27 DIAGNOSIS — J309 Allergic rhinitis, unspecified: Secondary | ICD-10-CM

## 2012-02-09 ENCOUNTER — Ambulatory Visit (INDEPENDENT_AMBULATORY_CARE_PROVIDER_SITE_OTHER): Payer: BC Managed Care – PPO

## 2012-02-09 DIAGNOSIS — J309 Allergic rhinitis, unspecified: Secondary | ICD-10-CM

## 2012-02-16 ENCOUNTER — Ambulatory Visit (INDEPENDENT_AMBULATORY_CARE_PROVIDER_SITE_OTHER): Payer: BC Managed Care – PPO

## 2012-02-16 DIAGNOSIS — J309 Allergic rhinitis, unspecified: Secondary | ICD-10-CM

## 2012-02-28 ENCOUNTER — Ambulatory Visit (INDEPENDENT_AMBULATORY_CARE_PROVIDER_SITE_OTHER): Payer: BC Managed Care – PPO

## 2012-02-28 DIAGNOSIS — J309 Allergic rhinitis, unspecified: Secondary | ICD-10-CM

## 2012-03-10 ENCOUNTER — Ambulatory Visit (INDEPENDENT_AMBULATORY_CARE_PROVIDER_SITE_OTHER): Payer: BC Managed Care – PPO

## 2012-03-10 DIAGNOSIS — J309 Allergic rhinitis, unspecified: Secondary | ICD-10-CM

## 2012-03-14 ENCOUNTER — Encounter: Payer: Self-pay | Admitting: Internal Medicine

## 2012-03-17 ENCOUNTER — Ambulatory Visit (INDEPENDENT_AMBULATORY_CARE_PROVIDER_SITE_OTHER): Payer: BC Managed Care – PPO

## 2012-03-17 DIAGNOSIS — J309 Allergic rhinitis, unspecified: Secondary | ICD-10-CM

## 2012-03-24 ENCOUNTER — Ambulatory Visit (INDEPENDENT_AMBULATORY_CARE_PROVIDER_SITE_OTHER): Payer: BC Managed Care – PPO

## 2012-03-24 DIAGNOSIS — J309 Allergic rhinitis, unspecified: Secondary | ICD-10-CM

## 2012-03-31 ENCOUNTER — Ambulatory Visit (INDEPENDENT_AMBULATORY_CARE_PROVIDER_SITE_OTHER): Payer: BC Managed Care – PPO

## 2012-03-31 DIAGNOSIS — J309 Allergic rhinitis, unspecified: Secondary | ICD-10-CM

## 2012-04-07 ENCOUNTER — Ambulatory Visit (INDEPENDENT_AMBULATORY_CARE_PROVIDER_SITE_OTHER): Payer: BC Managed Care – PPO

## 2012-04-07 DIAGNOSIS — J309 Allergic rhinitis, unspecified: Secondary | ICD-10-CM

## 2012-04-13 ENCOUNTER — Ambulatory Visit (INDEPENDENT_AMBULATORY_CARE_PROVIDER_SITE_OTHER): Payer: BC Managed Care – PPO

## 2012-04-13 DIAGNOSIS — J309 Allergic rhinitis, unspecified: Secondary | ICD-10-CM

## 2012-04-17 ENCOUNTER — Ambulatory Visit (INDEPENDENT_AMBULATORY_CARE_PROVIDER_SITE_OTHER): Payer: BC Managed Care – PPO

## 2012-04-17 DIAGNOSIS — J309 Allergic rhinitis, unspecified: Secondary | ICD-10-CM

## 2012-04-21 ENCOUNTER — Ambulatory Visit (INDEPENDENT_AMBULATORY_CARE_PROVIDER_SITE_OTHER): Payer: BC Managed Care – PPO

## 2012-04-21 DIAGNOSIS — J309 Allergic rhinitis, unspecified: Secondary | ICD-10-CM

## 2012-04-27 ENCOUNTER — Ambulatory Visit (INDEPENDENT_AMBULATORY_CARE_PROVIDER_SITE_OTHER): Payer: BC Managed Care – PPO

## 2012-04-27 DIAGNOSIS — J309 Allergic rhinitis, unspecified: Secondary | ICD-10-CM

## 2012-05-04 ENCOUNTER — Ambulatory Visit (INDEPENDENT_AMBULATORY_CARE_PROVIDER_SITE_OTHER): Payer: BC Managed Care – PPO

## 2012-05-04 DIAGNOSIS — J309 Allergic rhinitis, unspecified: Secondary | ICD-10-CM

## 2012-05-12 ENCOUNTER — Ambulatory Visit: Payer: 59 | Admitting: Internal Medicine

## 2012-05-15 ENCOUNTER — Ambulatory Visit (INDEPENDENT_AMBULATORY_CARE_PROVIDER_SITE_OTHER): Payer: BC Managed Care – PPO

## 2012-05-15 DIAGNOSIS — J309 Allergic rhinitis, unspecified: Secondary | ICD-10-CM

## 2012-05-19 ENCOUNTER — Ambulatory Visit (INDEPENDENT_AMBULATORY_CARE_PROVIDER_SITE_OTHER): Payer: BC Managed Care – PPO | Admitting: Internal Medicine

## 2012-05-19 ENCOUNTER — Encounter: Payer: Self-pay | Admitting: Internal Medicine

## 2012-05-19 VITALS — BP 118/70 | HR 80 | Ht 61.0 in | Wt 159.0 lb

## 2012-05-19 DIAGNOSIS — J301 Allergic rhinitis due to pollen: Secondary | ICD-10-CM

## 2012-05-19 NOTE — Patient Instructions (Addendum)
We can continue allergy vaccine  Please call as needed 

## 2012-05-19 NOTE — Progress Notes (Signed)
Patient ID: [name] [name], female    DOB: 07-20-1951, 61 y.o.   MRN: [id number]  HPI 05/13/11- 61 year old female never smoker followed for allergic rhinitis, lung nodules on chest x-ray/2010 Last here -05/01/2010 Wants to wait on flu shot. Has some nasal congestion. Was treated for sinusitis in August with Sudafed and Mucinex. We gave Augmentin and 5 days of prednisone. She feels nothing to blow out of her nose now. Chest is clear. Denies headache chest pain fever sore throat or nodes. Continues allergy vaccine.  10/29/11- 61 year old female never smoker followed for allergic rhinitis, lung nodules on chest x-ray/2010 Acute visit-sinus congestion as well as chest congestion; cough-prod-not much color; slight sneezing but rare-able to blow nose and no color. For 2 weeks has had head congestion extending into chest. Initially sore throat, now gone. Mucus clear with some brown. Feels stopped up. Chest tight without wheeze or cough. Taking Mucinex D each morning, Sudafed each evening.    CT chest 05/28/11-  Images reviewed w/ her-    IMPRESSION:  1. Stable 5 mm right upper lobe nodule, consistent with benign  etiology.  2. No active disease or other significant abnormality identified.  Original Report Authenticated By: Danae Orleans, M.D.   05/19/12- 61 year old female never smoker followed for allergic rhinitis, lung nodules on chest x-ray/2010  Pt states doing well, still taking allergy vaccine 1:10 GH.  Nasal congestion required medications through the wet weather but is much better now. Some pain at the opening of left nostril. Steroid nasal sprays all over -dried her. Uses saline gel.  Will get allergy vaccine elsewhere. Lung nodule appears benign reviewed from CT scan in October, 2012.  Review of Systems-see HPI Constitutional:   No-   weight loss, night sweats, fevers, chills, fatigue, lassitude. HEENT:   No-  headaches, difficulty swallowing, tooth/dental problems, sore throat,   No-  sneezing, itching, ear ache, +nasal congestion, post nasal drip, + pain at left nostril CV:  No-   chest pain, orthopnea, PND, swelling in lower extremities, anasarca, dizziness, palpitations Resp: No-   shortness of breath with exertion or at rest.              No-   productive cough,  No non-productive cough,  No-  coughing up of blood.              No-   change in color of mucus.  No- wheezing.   Skin: No-   rash or lesions. GI:  No-   heartburn, indigestion, abdominal pain, nausea, vomiting,  GU: No-   dysuria,  MS:  No-   joint pain or swelling.  . Neuro- grossly normal  Psych:  No- change in mood or affect. No depression or anxiety.  No memory loss.  Objective:   Physical Exam General- Alert, Oriented, Affect-appropriate, Distress- none acute Skin- rash-none, lesions- none, excoriation- none. Fair complected, looks flushed, question rosacea. Lymphadenopathy- none Head- atraumatic            Eyes- Gross vision intact, PERRLA, conjunctivae clear secretions            Ears- Hearing, canals-normal            Nose- + some redness of left nostril no-Septal dev, mucus, polyps, erosion, perforation             Throat- Mallampati III , mucosa clear , drainage- none, tonsils- atrophic Neck- flexible , trachea midline, no stridor , thyroid nl, carotid no bruit Chest - symmetrical  excursion , unlabored           Heart/CV- RRR , no murmur , no gallop  , no rub, nl s1 s2                           - JVD- none , edema- none, stasis changes- none, varices- none           Lung- clear to P&A, wheeze- none, cough- none , dullness-none, rub- none           Chest wall-  Abd-  Br/ Gen/ Rectal- Not done, not indicated Extrem- cyanosis- none, clubbing, none, atrophy- none, strength- nl Neuro- grossly intact to observation

## 2012-05-23 ENCOUNTER — Ambulatory Visit (INDEPENDENT_AMBULATORY_CARE_PROVIDER_SITE_OTHER): Payer: BC Managed Care – PPO

## 2012-05-23 DIAGNOSIS — J309 Allergic rhinitis, unspecified: Secondary | ICD-10-CM

## 2012-05-28 NOTE — Assessment & Plan Note (Signed)
We will continue allergy vaccine. Neosporin for probable folliculitis at left nostril

## 2012-05-30 ENCOUNTER — Ambulatory Visit (INDEPENDENT_AMBULATORY_CARE_PROVIDER_SITE_OTHER): Payer: BC Managed Care – PPO

## 2012-05-30 DIAGNOSIS — Z23 Encounter for immunization: Secondary | ICD-10-CM

## 2012-05-30 DIAGNOSIS — J309 Allergic rhinitis, unspecified: Secondary | ICD-10-CM

## 2012-06-07 ENCOUNTER — Ambulatory Visit (INDEPENDENT_AMBULATORY_CARE_PROVIDER_SITE_OTHER): Payer: BC Managed Care – PPO

## 2012-06-07 DIAGNOSIS — J309 Allergic rhinitis, unspecified: Secondary | ICD-10-CM

## 2012-06-12 ENCOUNTER — Telehealth: Payer: Self-pay | Admitting: Internal Medicine

## 2012-06-12 MED ORDER — AZITHROMYCIN 250 MG PO TABS: 250.0000 mg | ORAL_TABLET | ORAL | Status: DC

## 2012-06-12 NOTE — Telephone Encounter (Signed)
I spoke with pt and she c/o sore throat, PND, sinus pressure/HA, chills (unknown fever), blows out clear phlem, slight dry cough x 1 week. She has been taking extra strength tylenol and mucinex D 12 hr. She is requesting to have something called in. Please advise Dr. Maple Hudson thanks  Allergies  Allergen Reactions  . Dextromethorphan Other (See Comments)    Severe hot feeling, sweating, flushed,incoherent  . Codeine   . Darvon   . Propoxyphene Hcl

## 2012-06-12 NOTE — Telephone Encounter (Signed)
Spoke with patient in regards to [employer]. Paitent aware medication being called in.  Per CY Zpak called into [facility]

## 2012-06-13 ENCOUNTER — Encounter: Payer: Self-pay | Admitting: Internal Medicine

## 2012-06-16 ENCOUNTER — Ambulatory Visit (INDEPENDENT_AMBULATORY_CARE_PROVIDER_SITE_OTHER): Payer: BC Managed Care – PPO

## 2012-06-16 DIAGNOSIS — J309 Allergic rhinitis, unspecified: Secondary | ICD-10-CM

## 2012-06-22 ENCOUNTER — Ambulatory Visit (INDEPENDENT_AMBULATORY_CARE_PROVIDER_SITE_OTHER): Payer: BC Managed Care – PPO

## 2012-06-22 DIAGNOSIS — J309 Allergic rhinitis, unspecified: Secondary | ICD-10-CM

## 2012-07-03 ENCOUNTER — Ambulatory Visit (INDEPENDENT_AMBULATORY_CARE_PROVIDER_SITE_OTHER): Payer: BC Managed Care – PPO

## 2012-07-03 DIAGNOSIS — J309 Allergic rhinitis, unspecified: Secondary | ICD-10-CM

## 2012-07-10 ENCOUNTER — Ambulatory Visit (INDEPENDENT_AMBULATORY_CARE_PROVIDER_SITE_OTHER): Payer: BC Managed Care – PPO

## 2012-07-10 DIAGNOSIS — J309 Allergic rhinitis, unspecified: Secondary | ICD-10-CM

## 2012-07-17 ENCOUNTER — Ambulatory Visit (INDEPENDENT_AMBULATORY_CARE_PROVIDER_SITE_OTHER): Payer: BC Managed Care – PPO

## 2012-07-17 DIAGNOSIS — J309 Allergic rhinitis, unspecified: Secondary | ICD-10-CM

## 2012-07-25 ENCOUNTER — Ambulatory Visit (INDEPENDENT_AMBULATORY_CARE_PROVIDER_SITE_OTHER): Payer: BC Managed Care – PPO

## 2012-07-25 DIAGNOSIS — J309 Allergic rhinitis, unspecified: Secondary | ICD-10-CM

## 2012-08-01 ENCOUNTER — Ambulatory Visit (INDEPENDENT_AMBULATORY_CARE_PROVIDER_SITE_OTHER): Payer: BC Managed Care – PPO

## 2012-08-01 DIAGNOSIS — J309 Allergic rhinitis, unspecified: Secondary | ICD-10-CM

## 2012-08-08 ENCOUNTER — Ambulatory Visit (INDEPENDENT_AMBULATORY_CARE_PROVIDER_SITE_OTHER): Payer: BC Managed Care – PPO

## 2012-08-08 DIAGNOSIS — J309 Allergic rhinitis, unspecified: Secondary | ICD-10-CM

## 2012-08-17 ENCOUNTER — Ambulatory Visit (INDEPENDENT_AMBULATORY_CARE_PROVIDER_SITE_OTHER): Payer: BC Managed Care – PPO | Admitting: Internal Medicine

## 2012-08-17 ENCOUNTER — Encounter: Payer: Self-pay | Admitting: Internal Medicine

## 2012-08-17 VITALS — BP 144/90 | HR 110 | Ht 60.0 in | Wt 159.6 lb

## 2012-08-17 DIAGNOSIS — J069 Acute upper respiratory infection, unspecified: Secondary | ICD-10-CM

## 2012-08-17 DIAGNOSIS — J301 Allergic rhinitis due to pollen: Secondary | ICD-10-CM

## 2012-08-17 DIAGNOSIS — J984 Other disorders of lung: Secondary | ICD-10-CM

## 2012-08-17 MED ORDER — BENZONATATE 200 MG PO CAPS: 200.0000 mg | ORAL_CAPSULE | Freq: Three times a day (TID) | ORAL | Status: AC | PRN

## 2012-08-17 MED ORDER — LEVALBUTEROL HCL 0.63 MG/3ML IN NEBU
0.6300 mg | INHALATION_SOLUTION | Freq: Once | RESPIRATORY_TRACT | Status: AC
  Administered 2012-08-17: 0.63 mg via RESPIRATORY_TRACT

## 2012-08-17 MED ORDER — AZITHROMYCIN 250 MG PO TABS: ORAL_TABLET | ORAL | Status: DC

## 2012-08-17 MED ORDER — METHYLPREDNISOLONE ACETATE 80 MG/ML IJ SUSP
80.0000 mg | Freq: Once | INTRAMUSCULAR | Status: AC
  Administered 2012-08-17: 80 mg via INTRAMUSCULAR

## 2012-08-17 NOTE — Patient Instructions (Addendum)
Scripts for Zpak to hold for infection, and for tessalon/ benzonatate for cough  Fluids and mucinex  Neb xop 0.63  Depo 80

## 2012-08-17 NOTE — Progress Notes (Signed)
Patient ID: [name] [name], female    DOB: 23-Dec-1950, 61 y.o.   MRN: [id number]  HPI 05/13/11- 61 year old female never smoker followed for allergic rhinitis, lung nodules on chest x-ray/2010 Last here -05/01/2010 Wants to wait on flu shot. Has some nasal congestion. Was treated for sinusitis in August with Sudafed and Mucinex. We gave Augmentin and 5 days of prednisone. She feels nothing to blow out of her nose now. Chest is clear. Denies headache chest pain fever sore throat or nodes. Continues allergy vaccine.  10/29/11- 61 year old female never smoker followed for allergic rhinitis, lung nodules on chest x-ray/2010 Acute visit-sinus congestion as well as chest congestion; cough-prod-not much color; slight sneezing but rare-able to blow nose and no color. For 2 weeks has had head congestion extending into chest. Initially sore throat, now gone. Mucus clear with some brown. Feels stopped up. Chest tight without wheeze or cough. Taking Mucinex D each morning, Sudafed each evening.    CT chest 05/28/11-  Images reviewed w/ her-    IMPRESSION:  1. Stable 5 mm right upper lobe nodule, consistent with benign  etiology.  2. No active disease or other significant abnormality identified.  Original Report Authenticated By: Danae Orleans, M.D.   05/19/12- 61 year old female never smoker followed for allergic rhinitis, lung nodules on chest x-ray/2010  Pt states doing well, still taking allergy vaccine 1:10 GH.  Nasal congestion required medications through the wet weather but is much better now. Some pain at the opening of left nostril. Steroid nasal sprays all over -dried her. Uses saline gel.  Will get allergy vaccine elsewhere. Lung nodule appears benign reviewed from CT scan in October, 2012.  08/17/12- 61 year old female never smoker followed for allergic rhinitis, lung nodules on chest x-ray/2010 chest congestion, cough with a [place] amount of clear to yellow mucus, fatigue, wheezing, and f/c/s.   Symptoms started x 1 wk ago. Acute onset cough, fever, chills, yellow sputum, nasal congestion. No stomach upset. Little sore throat. Has had flu vaccine. Taking Mucinex D. CT chest 05/28/11- IMPRESSION:  1. Stable 5 mm right upper lobe nodule, consistent with benign  etiology.  2. No active disease or other significant abnormality identified.  Original Report Authenticated By: Danae Orleans, M.D.   Review of Systems-see HPI Constitutional:   No-   weight loss, night sweats,+ fevers, +chills, fatigue, lassitude. HEENT:   No-  headaches, difficulty swallowing, tooth/dental problems, sore throat,       No-  sneezing, itching, ear ache, +nasal congestion, post nasal drip,  CV:  No-   chest pain, orthopnea, PND, swelling in lower extremities, anasarca, dizziness, palpitations Resp: No-   shortness of breath with exertion or at rest.              +  productive cough,  No non-productive cough,  No-  coughing up of blood.             +  change in color of mucus.  No- wheezing.   Skin: No-   rash or lesions. GI:  No-   heartburn, indigestion, abdominal pain, nausea, vomiting,  GU: No-   dysuria,  MS:  No-   joint pain or swelling.  . Neuro- grossly normal  Psych:  No- change in mood or affect. No depression or anxiety.  No memory loss.  Objective:   Physical Exam General- Alert, Oriented, Affect-appropriate, Distress- none acute Skin- rash-none, lesions- none, excoriation- none. Fair complected, looks flushed, question rosacea. Lymphadenopathy- none Head- atraumatic  Eyes- Gross vision intact, PERRLA, conjunctivae clear secretions            Ears- Hearing, canals-normal            Nose- +nasal congestion, no-Septal dev, mucus, polyps, erosion, perforation             Throat- Mallampati III , +mucosa red without exudate , drainage- none, tonsils- atrophic Neck- flexible , trachea midline, no stridor , thyroid nl, carotid no bruit Chest - symmetrical excursion , unlabored            Heart/CV- RRR , no murmur , no gallop  , no rub, nl s1 s2                           - JVD- none , edema- none, stasis changes- none, varices- none           Lung- clear to P&A, wheeze- none, + dry cough , dullness-none, rub- none           Chest wall-  Abd-  Br/ Gen/ Rectal- Not done, not indicated Extrem- cyanosis- none, clubbing, none, atrophy- none, strength- nl Neuro- grossly intact to observation

## 2012-08-28 ENCOUNTER — Ambulatory Visit (INDEPENDENT_AMBULATORY_CARE_PROVIDER_SITE_OTHER): Payer: BC Managed Care – PPO

## 2012-08-28 DIAGNOSIS — J309 Allergic rhinitis, unspecified: Secondary | ICD-10-CM

## 2012-08-28 DIAGNOSIS — J069 Acute upper respiratory infection, unspecified: Secondary | ICD-10-CM | POA: Insufficient documentation

## 2012-08-28 NOTE — Assessment & Plan Note (Signed)
She reports doing well with allergy vaccine. No special concerns or problems. We discussed supplemental treatment if needed during spring pollen.

## 2012-08-28 NOTE — Assessment & Plan Note (Signed)
We're comfortable following this with occasional chest x-ray at this time.

## 2012-08-28 NOTE — Assessment & Plan Note (Signed)
Viral syndrome. We discussed management. Plan-supportive care, fluids. Z-Pak to hold through the holidays. Benzonatate

## 2012-09-06 ENCOUNTER — Ambulatory Visit (INDEPENDENT_AMBULATORY_CARE_PROVIDER_SITE_OTHER): Payer: BC Managed Care – PPO

## 2012-09-06 DIAGNOSIS — J309 Allergic rhinitis, unspecified: Secondary | ICD-10-CM

## 2012-09-13 ENCOUNTER — Ambulatory Visit: Payer: BC Managed Care – PPO

## 2012-09-15 ENCOUNTER — Ambulatory Visit (INDEPENDENT_AMBULATORY_CARE_PROVIDER_SITE_OTHER): Payer: BC Managed Care – PPO

## 2012-09-15 DIAGNOSIS — J309 Allergic rhinitis, unspecified: Secondary | ICD-10-CM

## 2012-09-20 ENCOUNTER — Ambulatory Visit: Payer: BC Managed Care – PPO

## 2012-09-22 ENCOUNTER — Ambulatory Visit (INDEPENDENT_AMBULATORY_CARE_PROVIDER_SITE_OTHER): Payer: BC Managed Care – PPO

## 2012-09-22 DIAGNOSIS — J309 Allergic rhinitis, unspecified: Secondary | ICD-10-CM

## 2012-09-28 ENCOUNTER — Ambulatory Visit: Payer: BC Managed Care – PPO

## 2012-09-29 ENCOUNTER — Ambulatory Visit (INDEPENDENT_AMBULATORY_CARE_PROVIDER_SITE_OTHER): Payer: BC Managed Care – PPO

## 2012-09-29 ENCOUNTER — Ambulatory Visit: Payer: BC Managed Care – PPO

## 2012-09-29 DIAGNOSIS — J309 Allergic rhinitis, unspecified: Secondary | ICD-10-CM

## 2012-10-02 ENCOUNTER — Ambulatory Visit (INDEPENDENT_AMBULATORY_CARE_PROVIDER_SITE_OTHER): Payer: BC Managed Care – PPO

## 2012-10-02 DIAGNOSIS — J309 Allergic rhinitis, unspecified: Secondary | ICD-10-CM

## 2012-10-04 ENCOUNTER — Ambulatory Visit (INDEPENDENT_AMBULATORY_CARE_PROVIDER_SITE_OTHER): Payer: BC Managed Care – PPO

## 2012-10-04 DIAGNOSIS — J309 Allergic rhinitis, unspecified: Secondary | ICD-10-CM

## 2012-10-12 ENCOUNTER — Ambulatory Visit (INDEPENDENT_AMBULATORY_CARE_PROVIDER_SITE_OTHER): Payer: BC Managed Care – PPO

## 2012-10-12 DIAGNOSIS — J309 Allergic rhinitis, unspecified: Secondary | ICD-10-CM

## 2012-10-16 ENCOUNTER — Encounter: Payer: Self-pay | Admitting: Internal Medicine

## 2012-10-18 ENCOUNTER — Ambulatory Visit (INDEPENDENT_AMBULATORY_CARE_PROVIDER_SITE_OTHER): Payer: BC Managed Care – PPO

## 2012-10-18 DIAGNOSIS — J309 Allergic rhinitis, unspecified: Secondary | ICD-10-CM

## 2012-10-19 ENCOUNTER — Ambulatory Visit: Payer: BC Managed Care – PPO

## 2012-10-26 ENCOUNTER — Ambulatory Visit (INDEPENDENT_AMBULATORY_CARE_PROVIDER_SITE_OTHER): Payer: BC Managed Care – PPO

## 2012-10-26 DIAGNOSIS — J309 Allergic rhinitis, unspecified: Secondary | ICD-10-CM

## 2012-11-01 ENCOUNTER — Ambulatory Visit (INDEPENDENT_AMBULATORY_CARE_PROVIDER_SITE_OTHER): Payer: BC Managed Care – PPO

## 2012-11-01 DIAGNOSIS — J309 Allergic rhinitis, unspecified: Secondary | ICD-10-CM

## 2012-11-08 ENCOUNTER — Ambulatory Visit (INDEPENDENT_AMBULATORY_CARE_PROVIDER_SITE_OTHER): Payer: BC Managed Care – PPO

## 2012-11-08 DIAGNOSIS — J309 Allergic rhinitis, unspecified: Secondary | ICD-10-CM

## 2012-11-15 ENCOUNTER — Ambulatory Visit: Payer: BC Managed Care – PPO

## 2012-11-17 ENCOUNTER — Ambulatory Visit (INDEPENDENT_AMBULATORY_CARE_PROVIDER_SITE_OTHER): Payer: BC Managed Care – PPO

## 2012-11-17 DIAGNOSIS — J309 Allergic rhinitis, unspecified: Secondary | ICD-10-CM

## 2012-11-22 ENCOUNTER — Ambulatory Visit: Payer: BC Managed Care – PPO

## 2012-11-23 ENCOUNTER — Ambulatory Visit (INDEPENDENT_AMBULATORY_CARE_PROVIDER_SITE_OTHER): Payer: BC Managed Care – PPO

## 2012-11-23 DIAGNOSIS — J309 Allergic rhinitis, unspecified: Secondary | ICD-10-CM

## 2012-11-30 ENCOUNTER — Ambulatory Visit (INDEPENDENT_AMBULATORY_CARE_PROVIDER_SITE_OTHER): Payer: BC Managed Care – PPO

## 2012-11-30 DIAGNOSIS — J309 Allergic rhinitis, unspecified: Secondary | ICD-10-CM

## 2012-12-07 ENCOUNTER — Ambulatory Visit (INDEPENDENT_AMBULATORY_CARE_PROVIDER_SITE_OTHER): Payer: BC Managed Care – PPO

## 2012-12-07 DIAGNOSIS — J309 Allergic rhinitis, unspecified: Secondary | ICD-10-CM

## 2012-12-14 ENCOUNTER — Ambulatory Visit: Payer: BC Managed Care – PPO

## 2012-12-26 ENCOUNTER — Ambulatory Visit (INDEPENDENT_AMBULATORY_CARE_PROVIDER_SITE_OTHER): Payer: BC Managed Care – PPO

## 2012-12-26 DIAGNOSIS — J309 Allergic rhinitis, unspecified: Secondary | ICD-10-CM

## 2013-01-05 ENCOUNTER — Ambulatory Visit (INDEPENDENT_AMBULATORY_CARE_PROVIDER_SITE_OTHER): Payer: BC Managed Care – PPO

## 2013-01-05 DIAGNOSIS — J309 Allergic rhinitis, unspecified: Secondary | ICD-10-CM

## 2013-01-12 ENCOUNTER — Ambulatory Visit: Payer: BC Managed Care – PPO

## 2013-01-16 ENCOUNTER — Ambulatory Visit (INDEPENDENT_AMBULATORY_CARE_PROVIDER_SITE_OTHER): Payer: BC Managed Care – PPO

## 2013-01-16 DIAGNOSIS — J309 Allergic rhinitis, unspecified: Secondary | ICD-10-CM

## 2013-01-24 ENCOUNTER — Encounter: Payer: Self-pay | Admitting: Internal Medicine

## 2013-01-24 ENCOUNTER — Telehealth: Payer: Self-pay | Admitting: Internal Medicine

## 2013-01-24 MED ORDER — PREDNISONE 10 MG PO TABS: ORAL_TABLET | ORAL | Status: DC

## 2013-01-24 NOTE — Telephone Encounter (Signed)
Called, spoke with pt.  C/o nasal congestion with clear mucus, congested cough with yellow mucus, sore throat off and on, PND, sinus pressure, and HA x 1 month.  Denies increased SOB, wheezing, chest tightness, chest pain, or f/c/s now.  Has tried benzonatate with relief, mucinex, and sudafed 10 mg.  Requesting further recs.  Dr. Maple Hudson, pls advise.  Thank you.  Last seen CDY on 08/17/12; asked to f/u in 1 yr Has pending OV on 05/21/13 with [facility] Allergies verified with pt: Allergies  Allergen Reactions  . Dextromethorphan Other (See Comments)    Severe hot feeling, sweating, flushed,incoherent  . Codeine   . Darvon   . Propoxyphene Hcl

## 2013-01-24 NOTE — Telephone Encounter (Signed)
Per CY- lets give patient Prednisone 10 mg #7 take 1 po qd x 7 days no refills.

## 2013-01-24 NOTE — Telephone Encounter (Signed)
Called, spoke with pt.  Informed her of below per Dr. Maple Hudson.  She verbalized understanding and aware pred rx sent to [facility]. Pt requesting something to take in addition to the prednisone for "severe" PND.  I spoke with Dr. Maple Hudson.  Per CDY: pt can try allegra 180 qd. Pt aware of CDY's recs and verbalized understanding.  She voiced no further questions or concerns at this time and is to call back if symptoms do not improve or worsen.

## 2013-01-25 ENCOUNTER — Ambulatory Visit: Payer: BC Managed Care – PPO

## 2013-01-26 ENCOUNTER — Ambulatory Visit (INDEPENDENT_AMBULATORY_CARE_PROVIDER_SITE_OTHER): Payer: BC Managed Care – PPO

## 2013-01-26 DIAGNOSIS — J309 Allergic rhinitis, unspecified: Secondary | ICD-10-CM

## 2013-01-30 ENCOUNTER — Ambulatory Visit (INDEPENDENT_AMBULATORY_CARE_PROVIDER_SITE_OTHER): Payer: BC Managed Care – PPO

## 2013-01-30 DIAGNOSIS — J309 Allergic rhinitis, unspecified: Secondary | ICD-10-CM

## 2013-02-02 ENCOUNTER — Ambulatory Visit: Payer: BC Managed Care – PPO

## 2013-02-15 ENCOUNTER — Ambulatory Visit (INDEPENDENT_AMBULATORY_CARE_PROVIDER_SITE_OTHER): Payer: BC Managed Care – PPO

## 2013-02-15 DIAGNOSIS — J309 Allergic rhinitis, unspecified: Secondary | ICD-10-CM

## 2013-02-20 ENCOUNTER — Ambulatory Visit: Payer: BC Managed Care – PPO

## 2013-02-22 ENCOUNTER — Ambulatory Visit: Payer: BC Managed Care – PPO

## 2013-02-26 ENCOUNTER — Ambulatory Visit (INDEPENDENT_AMBULATORY_CARE_PROVIDER_SITE_OTHER): Payer: BC Managed Care – PPO

## 2013-02-26 DIAGNOSIS — J309 Allergic rhinitis, unspecified: Secondary | ICD-10-CM

## 2013-03-06 ENCOUNTER — Ambulatory Visit (INDEPENDENT_AMBULATORY_CARE_PROVIDER_SITE_OTHER): Payer: BC Managed Care – PPO

## 2013-03-06 DIAGNOSIS — J309 Allergic rhinitis, unspecified: Secondary | ICD-10-CM

## 2013-03-20 ENCOUNTER — Ambulatory Visit: Payer: BC Managed Care – PPO

## 2013-03-22 ENCOUNTER — Ambulatory Visit (INDEPENDENT_AMBULATORY_CARE_PROVIDER_SITE_OTHER): Payer: BC Managed Care – PPO

## 2013-03-22 DIAGNOSIS — J309 Allergic rhinitis, unspecified: Secondary | ICD-10-CM

## 2013-03-29 ENCOUNTER — Ambulatory Visit (INDEPENDENT_AMBULATORY_CARE_PROVIDER_SITE_OTHER): Payer: BC Managed Care – PPO

## 2013-03-29 DIAGNOSIS — J309 Allergic rhinitis, unspecified: Secondary | ICD-10-CM

## 2013-04-04 ENCOUNTER — Ambulatory Visit (INDEPENDENT_AMBULATORY_CARE_PROVIDER_SITE_OTHER): Payer: BC Managed Care – PPO

## 2013-04-04 DIAGNOSIS — J309 Allergic rhinitis, unspecified: Secondary | ICD-10-CM

## 2013-04-05 ENCOUNTER — Ambulatory Visit: Payer: BC Managed Care – PPO

## 2013-04-11 ENCOUNTER — Ambulatory Visit: Payer: BC Managed Care – PPO

## 2013-04-13 ENCOUNTER — Ambulatory Visit (INDEPENDENT_AMBULATORY_CARE_PROVIDER_SITE_OTHER): Payer: BC Managed Care – PPO

## 2013-04-13 DIAGNOSIS — J309 Allergic rhinitis, unspecified: Secondary | ICD-10-CM

## 2013-04-19 ENCOUNTER — Ambulatory Visit: Payer: BC Managed Care – PPO

## 2013-04-20 ENCOUNTER — Ambulatory Visit (INDEPENDENT_AMBULATORY_CARE_PROVIDER_SITE_OTHER): Payer: BC Managed Care – PPO

## 2013-04-20 DIAGNOSIS — J309 Allergic rhinitis, unspecified: Secondary | ICD-10-CM

## 2013-04-26 ENCOUNTER — Ambulatory Visit: Payer: BC Managed Care – PPO

## 2013-04-27 ENCOUNTER — Ambulatory Visit (INDEPENDENT_AMBULATORY_CARE_PROVIDER_SITE_OTHER): Payer: BC Managed Care – PPO

## 2013-04-27 DIAGNOSIS — J309 Allergic rhinitis, unspecified: Secondary | ICD-10-CM

## 2013-05-04 ENCOUNTER — Ambulatory Visit (INDEPENDENT_AMBULATORY_CARE_PROVIDER_SITE_OTHER): Payer: BC Managed Care – PPO

## 2013-05-04 DIAGNOSIS — J309 Allergic rhinitis, unspecified: Secondary | ICD-10-CM

## 2013-05-11 ENCOUNTER — Ambulatory Visit (INDEPENDENT_AMBULATORY_CARE_PROVIDER_SITE_OTHER): Payer: BC Managed Care – PPO

## 2013-05-11 DIAGNOSIS — J309 Allergic rhinitis, unspecified: Secondary | ICD-10-CM

## 2013-05-16 ENCOUNTER — Ambulatory Visit (INDEPENDENT_AMBULATORY_CARE_PROVIDER_SITE_OTHER): Payer: BC Managed Care – PPO

## 2013-05-16 DIAGNOSIS — J309 Allergic rhinitis, unspecified: Secondary | ICD-10-CM

## 2013-05-18 ENCOUNTER — Ambulatory Visit: Payer: BC Managed Care – PPO

## 2013-05-21 ENCOUNTER — Ambulatory Visit: Payer: BC Managed Care – PPO | Admitting: Internal Medicine

## 2013-05-23 ENCOUNTER — Ambulatory Visit: Payer: BC Managed Care – PPO

## 2013-05-24 ENCOUNTER — Ambulatory Visit (INDEPENDENT_AMBULATORY_CARE_PROVIDER_SITE_OTHER): Payer: BC Managed Care – PPO

## 2013-05-24 DIAGNOSIS — J309 Allergic rhinitis, unspecified: Secondary | ICD-10-CM

## 2013-06-01 ENCOUNTER — Ambulatory Visit (INDEPENDENT_AMBULATORY_CARE_PROVIDER_SITE_OTHER): Payer: BC Managed Care – PPO

## 2013-06-01 ENCOUNTER — Encounter: Payer: Self-pay | Admitting: Internal Medicine

## 2013-06-01 ENCOUNTER — Ambulatory Visit (INDEPENDENT_AMBULATORY_CARE_PROVIDER_SITE_OTHER): Payer: BC Managed Care – PPO | Admitting: Internal Medicine

## 2013-06-01 VITALS — BP 118/70 | HR 78 | Ht 60.0 in | Wt 169.2 lb

## 2013-06-01 DIAGNOSIS — J309 Allergic rhinitis, unspecified: Secondary | ICD-10-CM

## 2013-06-01 DIAGNOSIS — J301 Allergic rhinitis due to pollen: Secondary | ICD-10-CM

## 2013-06-01 NOTE — Patient Instructions (Signed)
We can continue allergy vaccine 1:10 GH  Ok to try using an otc antihistamine like Claritin/ loratadine as needed  Ok to try gargling gently from time to time  Pay attention to whether at times you might be refluxing a little from your stomach, especially during the night, lying down.

## 2013-06-01 NOTE — Progress Notes (Signed)
Patient ID: [name] [name], female    DOB: 05/28/1951, 62 y.o.   MRN: [id number]  HPI 05/13/11- 62 year old female never smoker followed for allergic rhinitis, lung nodules on chest x-ray/2010 Last here -05/01/2010 Wants to wait on flu shot. Has some nasal congestion. Was treated for sinusitis in August with Sudafed and Mucinex. We gave Augmentin and 5 days of prednisone. She feels nothing to blow out of her nose now. Chest is clear. Denies headache chest pain fever sore throat or nodes. Continues allergy vaccine.  10/29/11- 61 year old female never smoker followed for allergic rhinitis, lung nodules on chest x-ray/2010 Acute visit-sinus congestion as well as chest congestion; cough-prod-not much color; slight sneezing but rare-able to blow nose and no color. For 2 weeks has had head congestion extending into chest. Initially sore throat, now gone. Mucus clear with some brown. Feels stopped up. Chest tight without wheeze or cough. Taking Mucinex D each morning, Sudafed each evening.    CT chest 05/28/11-  Images reviewed w/ her-    IMPRESSION:  1. Stable 5 mm right upper lobe nodule, consistent with benign  etiology.  2. No active disease or other significant abnormality identified.  Original Report Authenticated By: Danae Orleans, M.D.   05/19/12- 62 year old female never smoker followed for allergic rhinitis, lung nodules on chest x-ray/2010  Pt states doing well, still taking allergy vaccine 1:10 GH.  Nasal congestion required medications through the wet weather but is much better now. Some pain at the opening of left nostril. Steroid nasal sprays all over -dried her. Uses saline gel.  Will get allergy vaccine elsewhere. Lung nodule appears benign reviewed from CT scan in October, 2012.  08/17/12- 62 year old female never smoker followed for allergic rhinitis, lung nodules on chest x-ray/2010 chest congestion, cough with a [place] amount of clear to yellow mucus, fatigue, wheezing, and f/c/s.   Symptoms started x 1 wk ago. Acute onset cough, fever, chills, yellow sputum, nasal congestion. No stomach upset. Little sore throat. Has had flu vaccine. Taking Mucinex D. CT chest 05/28/11- IMPRESSION:  1. Stable 5 mm right upper lobe nodule, consistent with benign  etiology.  2. No active disease or other significant abnormality identified.  Original Report Authenticated By: Danae Orleans, M.D.   06/01/13- 62 year old female never smoker followed for allergic rhinitis, lung nodules on chest x-ray/2010 FOLLOWS FOR: still on Allergy vaccine 1:10 GH and doing well. Wants to wait on flu shot-discussed. Rare need for antihistamine. Uses saline nasal spray or gel.  Review of Systems-see HPI Constitutional:   No-   weight loss, night sweats,no- fevers, no-chills, fatigue, lassitude. HEENT:   No-  headaches, difficulty swallowing, tooth/dental problems, sore throat,       No-  sneezing, itching, ear ache, +nasal congestion, post nasal drip,  CV:  No-   chest pain, orthopnea, PND, swelling in lower extremities, anasarca, dizziness, palpitations Resp: No-   shortness of breath with exertion or at rest.             No-  productive cough,  No non-productive cough,  No-  coughing up of blood.             No- change in color of mucus.  No- wheezing.   Skin: No-   rash or lesions. GI:  No-   heartburn, indigestion, abdominal pain, nausea, vomiting,  GU:  MS:  No-   joint pain or swelling.  . Neuro- grossly normal  Psych:  No- change in mood or affect. No  depression or anxiety.  No memory loss.  Objective:   Physical Exam General- Alert, Oriented, Affect-appropriate, Distress- none acute Skin- rash-none, lesions- none, excoriation- none. Fair complected, question rosacea. Lymphadenopathy- none Head- atraumatic            Eyes- Gross vision intact, PERRLA, conjunctivae clear secretions            Ears- Hearing, canals-normal            Nose-  no-Septal dev, mucus+ minor crusting, no-polyps,  erosion, perforation             Throat- Mallampati III , +mucosa red without exudate , drainage- none, tonsils- atrophic Neck- flexible , trachea midline, no stridor , thyroid nl, carotid no bruit Chest - symmetrical excursion , unlabored           Heart/CV- RRR , no murmur , no gallop  , no rub, nl s1 s2                           - JVD- none , edema- none, stasis changes- none, varices- none           Lung- clear to P&A, wheeze- none,  coughnon- , dullness-none, rub- none           Chest wall-  Abd-  Br/ Gen/ Rectal- Not done, not indicated Extrem- cyanosis- none, clubbing, none, atrophy- none, strength- nl Neuro- grossly intact to observation

## 2013-06-08 ENCOUNTER — Ambulatory Visit (INDEPENDENT_AMBULATORY_CARE_PROVIDER_SITE_OTHER): Payer: BC Managed Care – PPO

## 2013-06-08 DIAGNOSIS — J309 Allergic rhinitis, unspecified: Secondary | ICD-10-CM

## 2013-06-08 DIAGNOSIS — Z23 Encounter for immunization: Secondary | ICD-10-CM

## 2013-06-13 ENCOUNTER — Ambulatory Visit (INDEPENDENT_AMBULATORY_CARE_PROVIDER_SITE_OTHER): Payer: BC Managed Care – PPO

## 2013-06-13 DIAGNOSIS — J309 Allergic rhinitis, unspecified: Secondary | ICD-10-CM

## 2013-06-17 NOTE — Assessment & Plan Note (Signed)
She is satisfied to continue allergy vaccine and feels she is doing well

## 2013-06-21 ENCOUNTER — Ambulatory Visit: Payer: BC Managed Care – PPO

## 2013-06-22 ENCOUNTER — Ambulatory Visit (INDEPENDENT_AMBULATORY_CARE_PROVIDER_SITE_OTHER): Payer: BC Managed Care – PPO

## 2013-06-22 DIAGNOSIS — J309 Allergic rhinitis, unspecified: Secondary | ICD-10-CM

## 2013-06-29 ENCOUNTER — Ambulatory Visit (INDEPENDENT_AMBULATORY_CARE_PROVIDER_SITE_OTHER): Payer: BC Managed Care – PPO

## 2013-06-29 DIAGNOSIS — J309 Allergic rhinitis, unspecified: Secondary | ICD-10-CM

## 2013-07-04 ENCOUNTER — Ambulatory Visit (INDEPENDENT_AMBULATORY_CARE_PROVIDER_SITE_OTHER): Payer: BC Managed Care – PPO

## 2013-07-04 DIAGNOSIS — J309 Allergic rhinitis, unspecified: Secondary | ICD-10-CM

## 2013-07-12 ENCOUNTER — Ambulatory Visit (INDEPENDENT_AMBULATORY_CARE_PROVIDER_SITE_OTHER): Payer: BC Managed Care – PPO

## 2013-07-12 DIAGNOSIS — J309 Allergic rhinitis, unspecified: Secondary | ICD-10-CM

## 2013-07-13 ENCOUNTER — Ambulatory Visit: Payer: BC Managed Care – PPO

## 2013-07-18 ENCOUNTER — Ambulatory Visit (INDEPENDENT_AMBULATORY_CARE_PROVIDER_SITE_OTHER): Payer: BC Managed Care – PPO

## 2013-07-18 DIAGNOSIS — J309 Allergic rhinitis, unspecified: Secondary | ICD-10-CM

## 2013-07-20 ENCOUNTER — Ambulatory Visit (INDEPENDENT_AMBULATORY_CARE_PROVIDER_SITE_OTHER): Payer: BC Managed Care – PPO

## 2013-07-20 DIAGNOSIS — J309 Allergic rhinitis, unspecified: Secondary | ICD-10-CM

## 2013-07-26 ENCOUNTER — Ambulatory Visit: Payer: BC Managed Care – PPO

## 2013-07-27 ENCOUNTER — Ambulatory Visit (INDEPENDENT_AMBULATORY_CARE_PROVIDER_SITE_OTHER): Payer: BC Managed Care – PPO

## 2013-07-27 DIAGNOSIS — J309 Allergic rhinitis, unspecified: Secondary | ICD-10-CM

## 2013-08-01 ENCOUNTER — Ambulatory Visit (INDEPENDENT_AMBULATORY_CARE_PROVIDER_SITE_OTHER): Payer: BC Managed Care – PPO

## 2013-08-01 DIAGNOSIS — J309 Allergic rhinitis, unspecified: Secondary | ICD-10-CM

## 2013-08-08 ENCOUNTER — Ambulatory Visit: Payer: BC Managed Care – PPO

## 2013-08-10 ENCOUNTER — Ambulatory Visit (INDEPENDENT_AMBULATORY_CARE_PROVIDER_SITE_OTHER): Payer: BC Managed Care – PPO

## 2013-08-10 DIAGNOSIS — J309 Allergic rhinitis, unspecified: Secondary | ICD-10-CM

## 2013-08-17 ENCOUNTER — Ambulatory Visit (INDEPENDENT_AMBULATORY_CARE_PROVIDER_SITE_OTHER): Payer: BC Managed Care – PPO

## 2013-08-17 DIAGNOSIS — J309 Allergic rhinitis, unspecified: Secondary | ICD-10-CM

## 2013-08-24 ENCOUNTER — Ambulatory Visit (INDEPENDENT_AMBULATORY_CARE_PROVIDER_SITE_OTHER): Payer: BC Managed Care – PPO

## 2013-08-24 ENCOUNTER — Encounter: Payer: Self-pay | Admitting: Internal Medicine

## 2013-08-24 DIAGNOSIS — J309 Allergic rhinitis, unspecified: Secondary | ICD-10-CM

## 2013-08-29 ENCOUNTER — Telehealth: Payer: Self-pay | Admitting: Internal Medicine

## 2013-08-29 MED ORDER — AZITHROMYCIN 250 MG PO TABS: ORAL_TABLET | ORAL | Status: DC

## 2013-08-29 NOTE — Telephone Encounter (Signed)
Ok to send Z pak 

## 2013-08-29 NOTE — Telephone Encounter (Signed)
Called and spoke with pt. Aware of recs. rx [facility] to [facility] Scientist, product/process development. Nothing further needed

## 2013-08-29 NOTE — Telephone Encounter (Signed)
Called and spoke with pt and she stated that she has a sinus infection.  Started about a week ago with nasal congestion and today is worse.  Pt has been using mucinex, saline spray, vaporizer.  She stated that she has been tx in the past with zpak but she will do what ever CY feels is best for her.  CY please advise. Thanks  Last ov--06/01/2013 Next ov--06/03/2014  Allergies  Allergen Reactions  . Dextromethorphan Other (See Comments)    Severe hot feeling, sweating, flushed,incoherent  . Codeine   . Darvon   . Propoxyphene Hcl      Current Outpatient Prescriptions on File Prior to Visit  Medication Sig Dispense Refill  . Cholecalciferol (VITAMIN D3) 3000 UNITS TABS Take 4,000 Units by mouth daily.       . Multiple Vitamins-Minerals (CENTRUM SILVER) tablet Take 1 tablet by mouth daily.      . phenylephrine (SUDAFED PE) 10 MG TABS Take 10 mg by mouth every 4 (four) hours as needed.        . simvastatin (ZOCOR) 20 MG tablet Take 20 mg by mouth at bedtime.        . vitamin C (ASCORBIC ACID) 500 MG tablet Take 500 mg by mouth daily.      . [DISCONTINUED] mometasone (NASONEX) 50 MCG/ACT nasal spray Place 2 sprays into the nose daily. At bedtime  17 g  2   No current facility-administered medications on file prior to visit.

## 2013-08-30 ENCOUNTER — Ambulatory Visit: Payer: BC Managed Care – PPO

## 2013-09-04 ENCOUNTER — Ambulatory Visit (INDEPENDENT_AMBULATORY_CARE_PROVIDER_SITE_OTHER): Payer: BC Managed Care – PPO

## 2013-09-04 DIAGNOSIS — J309 Allergic rhinitis, unspecified: Secondary | ICD-10-CM

## 2013-09-13 ENCOUNTER — Ambulatory Visit (INDEPENDENT_AMBULATORY_CARE_PROVIDER_SITE_OTHER): Payer: BC Managed Care – PPO

## 2013-09-13 DIAGNOSIS — J309 Allergic rhinitis, unspecified: Secondary | ICD-10-CM

## 2013-09-20 ENCOUNTER — Ambulatory Visit: Payer: BC Managed Care – PPO

## 2013-09-21 ENCOUNTER — Ambulatory Visit (INDEPENDENT_AMBULATORY_CARE_PROVIDER_SITE_OTHER): Payer: BC Managed Care – PPO

## 2013-09-21 DIAGNOSIS — J309 Allergic rhinitis, unspecified: Secondary | ICD-10-CM

## 2013-09-28 ENCOUNTER — Ambulatory Visit (INDEPENDENT_AMBULATORY_CARE_PROVIDER_SITE_OTHER): Payer: BC Managed Care – PPO

## 2013-09-28 DIAGNOSIS — J309 Allergic rhinitis, unspecified: Secondary | ICD-10-CM

## 2013-10-05 ENCOUNTER — Ambulatory Visit (INDEPENDENT_AMBULATORY_CARE_PROVIDER_SITE_OTHER): Payer: BC Managed Care – PPO

## 2013-10-05 DIAGNOSIS — J309 Allergic rhinitis, unspecified: Secondary | ICD-10-CM

## 2013-10-12 ENCOUNTER — Ambulatory Visit (INDEPENDENT_AMBULATORY_CARE_PROVIDER_SITE_OTHER): Payer: BC Managed Care – PPO

## 2013-10-12 DIAGNOSIS — J309 Allergic rhinitis, unspecified: Secondary | ICD-10-CM

## 2013-10-17 ENCOUNTER — Ambulatory Visit (INDEPENDENT_AMBULATORY_CARE_PROVIDER_SITE_OTHER): Payer: BC Managed Care – PPO

## 2013-10-17 DIAGNOSIS — J309 Allergic rhinitis, unspecified: Secondary | ICD-10-CM

## 2013-10-19 ENCOUNTER — Ambulatory Visit: Payer: BC Managed Care – PPO

## 2013-10-26 ENCOUNTER — Ambulatory Visit (INDEPENDENT_AMBULATORY_CARE_PROVIDER_SITE_OTHER): Payer: BC Managed Care – PPO

## 2013-10-26 DIAGNOSIS — J309 Allergic rhinitis, unspecified: Secondary | ICD-10-CM

## 2013-11-02 ENCOUNTER — Ambulatory Visit (INDEPENDENT_AMBULATORY_CARE_PROVIDER_SITE_OTHER): Payer: BC Managed Care – PPO

## 2013-11-02 DIAGNOSIS — J309 Allergic rhinitis, unspecified: Secondary | ICD-10-CM

## 2013-11-14 ENCOUNTER — Ambulatory Visit (INDEPENDENT_AMBULATORY_CARE_PROVIDER_SITE_OTHER): Payer: BC Managed Care – PPO

## 2013-11-14 DIAGNOSIS — J309 Allergic rhinitis, unspecified: Secondary | ICD-10-CM

## 2013-11-16 ENCOUNTER — Telehealth: Payer: Self-pay | Admitting: Internal Medicine

## 2013-11-16 ENCOUNTER — Ambulatory Visit: Payer: BC Managed Care – PPO

## 2013-11-16 NOTE — Telephone Encounter (Signed)
Called and made pt aware of recs. Nothing further needed 

## 2013-11-16 NOTE — Telephone Encounter (Signed)
Suggest Claritin/ loratadine or Allegra/ fexofenadine 1 daily. Can also add otc decongestant Sudafed-PE once each morning if needed.

## 2013-11-16 NOTE — Telephone Encounter (Signed)
Spoke with patient-she states she has been taking her allergy injections weekly; however for about 2 weeks now-having increased allergy symptoms, sore throat, sever drainage, and has moved to her ears. She has tried plain Mucinex, Reg Sudafed, warm/salt water, Tylenol, and throat spray. Pt would like recs or meds from CY for this.    Allergies  Allergen Reactions  . Dextromethorphan Other (See Comments)    Severe hot feeling, sweating, flushed,incoherent  . Codeine   . Darvon   . Propoxyphene Hcl     Current Outpatient Prescriptions on File Prior to Visit  Medication Sig Dispense Refill  . azithromycin (ZITHROMAX) 250 MG tablet Take as directed  6 tablet  0  . Cholecalciferol (VITAMIN D3) 3000 UNITS TABS Take 4,000 Units by mouth daily.       . Multiple Vitamins-Minerals (CENTRUM SILVER) tablet Take 1 tablet by mouth daily.      . phenylephrine (SUDAFED PE) 10 MG TABS Take 10 mg by mouth every 4 (four) hours as needed.        . simvastatin (ZOCOR) 20 MG tablet Take 20 mg by mouth at bedtime.        . vitamin C (ASCORBIC ACID) 500 MG tablet Take 500 mg by mouth daily.      . [DISCONTINUED] mometasone (NASONEX) 50 MCG/ACT nasal spray Place 2 sprays into the nose daily. At bedtime  17 g  2   No current facility-administered medications on file prior to visit.

## 2013-11-22 ENCOUNTER — Ambulatory Visit (INDEPENDENT_AMBULATORY_CARE_PROVIDER_SITE_OTHER): Payer: BC Managed Care – PPO

## 2013-11-22 DIAGNOSIS — J309 Allergic rhinitis, unspecified: Secondary | ICD-10-CM

## 2013-11-29 ENCOUNTER — Ambulatory Visit (INDEPENDENT_AMBULATORY_CARE_PROVIDER_SITE_OTHER): Payer: BC Managed Care – PPO

## 2013-11-29 DIAGNOSIS — J309 Allergic rhinitis, unspecified: Secondary | ICD-10-CM

## 2013-11-30 ENCOUNTER — Ambulatory Visit: Payer: BC Managed Care – PPO

## 2013-12-07 ENCOUNTER — Ambulatory Visit: Payer: BC Managed Care – PPO

## 2013-12-10 ENCOUNTER — Ambulatory Visit (INDEPENDENT_AMBULATORY_CARE_PROVIDER_SITE_OTHER): Payer: BC Managed Care – PPO

## 2013-12-10 DIAGNOSIS — J309 Allergic rhinitis, unspecified: Secondary | ICD-10-CM

## 2013-12-31 ENCOUNTER — Ambulatory Visit (INDEPENDENT_AMBULATORY_CARE_PROVIDER_SITE_OTHER): Payer: BC Managed Care – PPO

## 2013-12-31 DIAGNOSIS — J309 Allergic rhinitis, unspecified: Secondary | ICD-10-CM

## 2014-01-07 ENCOUNTER — Ambulatory Visit (INDEPENDENT_AMBULATORY_CARE_PROVIDER_SITE_OTHER): Payer: BC Managed Care – PPO

## 2014-01-07 DIAGNOSIS — J309 Allergic rhinitis, unspecified: Secondary | ICD-10-CM

## 2014-01-09 ENCOUNTER — Ambulatory Visit (INDEPENDENT_AMBULATORY_CARE_PROVIDER_SITE_OTHER): Payer: BC Managed Care – PPO

## 2014-01-09 DIAGNOSIS — J309 Allergic rhinitis, unspecified: Secondary | ICD-10-CM

## 2014-01-16 ENCOUNTER — Ambulatory Visit (INDEPENDENT_AMBULATORY_CARE_PROVIDER_SITE_OTHER): Payer: BC Managed Care – PPO

## 2014-01-16 DIAGNOSIS — J309 Allergic rhinitis, unspecified: Secondary | ICD-10-CM

## 2014-01-22 ENCOUNTER — Encounter: Payer: Self-pay | Admitting: Internal Medicine

## 2014-01-25 ENCOUNTER — Ambulatory Visit (INDEPENDENT_AMBULATORY_CARE_PROVIDER_SITE_OTHER): Payer: BC Managed Care – PPO

## 2014-01-25 DIAGNOSIS — J309 Allergic rhinitis, unspecified: Secondary | ICD-10-CM

## 2014-01-31 ENCOUNTER — Ambulatory Visit (INDEPENDENT_AMBULATORY_CARE_PROVIDER_SITE_OTHER): Payer: BC Managed Care – PPO

## 2014-01-31 DIAGNOSIS — J309 Allergic rhinitis, unspecified: Secondary | ICD-10-CM

## 2014-02-01 ENCOUNTER — Ambulatory Visit: Payer: BC Managed Care – PPO

## 2014-02-07 ENCOUNTER — Ambulatory Visit: Payer: BC Managed Care – PPO

## 2014-02-08 ENCOUNTER — Ambulatory Visit (INDEPENDENT_AMBULATORY_CARE_PROVIDER_SITE_OTHER): Payer: BC Managed Care – PPO

## 2014-02-08 DIAGNOSIS — J309 Allergic rhinitis, unspecified: Secondary | ICD-10-CM

## 2014-02-19 ENCOUNTER — Other Ambulatory Visit: Payer: Self-pay | Admitting: Dermatology

## 2014-02-21 ENCOUNTER — Ambulatory Visit (INDEPENDENT_AMBULATORY_CARE_PROVIDER_SITE_OTHER): Payer: BC Managed Care – PPO

## 2014-02-21 DIAGNOSIS — J309 Allergic rhinitis, unspecified: Secondary | ICD-10-CM

## 2014-02-28 ENCOUNTER — Ambulatory Visit (INDEPENDENT_AMBULATORY_CARE_PROVIDER_SITE_OTHER): Payer: BC Managed Care – PPO

## 2014-02-28 DIAGNOSIS — J309 Allergic rhinitis, unspecified: Secondary | ICD-10-CM

## 2014-03-07 ENCOUNTER — Ambulatory Visit (INDEPENDENT_AMBULATORY_CARE_PROVIDER_SITE_OTHER): Payer: BC Managed Care – PPO

## 2014-03-07 DIAGNOSIS — J309 Allergic rhinitis, unspecified: Secondary | ICD-10-CM

## 2014-03-14 ENCOUNTER — Ambulatory Visit (INDEPENDENT_AMBULATORY_CARE_PROVIDER_SITE_OTHER): Payer: BC Managed Care – PPO

## 2014-03-14 DIAGNOSIS — J309 Allergic rhinitis, unspecified: Secondary | ICD-10-CM

## 2014-03-21 ENCOUNTER — Ambulatory Visit (INDEPENDENT_AMBULATORY_CARE_PROVIDER_SITE_OTHER): Payer: BC Managed Care – PPO

## 2014-03-21 DIAGNOSIS — J309 Allergic rhinitis, unspecified: Secondary | ICD-10-CM

## 2014-03-28 ENCOUNTER — Ambulatory Visit (INDEPENDENT_AMBULATORY_CARE_PROVIDER_SITE_OTHER): Payer: Medicare Other

## 2014-03-28 DIAGNOSIS — J309 Allergic rhinitis, unspecified: Secondary | ICD-10-CM

## 2014-04-04 ENCOUNTER — Ambulatory Visit (INDEPENDENT_AMBULATORY_CARE_PROVIDER_SITE_OTHER): Payer: Medicare Other

## 2014-04-04 DIAGNOSIS — J309 Allergic rhinitis, unspecified: Secondary | ICD-10-CM | POA: Diagnosis not present

## 2014-04-11 ENCOUNTER — Ambulatory Visit (INDEPENDENT_AMBULATORY_CARE_PROVIDER_SITE_OTHER): Payer: Medicare Other

## 2014-04-11 DIAGNOSIS — J309 Allergic rhinitis, unspecified: Secondary | ICD-10-CM

## 2014-04-18 ENCOUNTER — Ambulatory Visit (INDEPENDENT_AMBULATORY_CARE_PROVIDER_SITE_OTHER): Payer: Medicare Other

## 2014-04-18 DIAGNOSIS — J309 Allergic rhinitis, unspecified: Secondary | ICD-10-CM | POA: Diagnosis not present

## 2014-04-19 ENCOUNTER — Telehealth: Payer: Self-pay | Admitting: Internal Medicine

## 2014-04-19 MED ORDER — AMOXICILLIN-POT CLAVULANATE 875-125 MG PO TABS: 1.0000 | ORAL_TABLET | Freq: Two times a day (BID) | ORAL | Status: DC

## 2014-04-19 NOTE — Telephone Encounter (Signed)
Called, spoke with pt - Informed her of below recs per Dr. Annamaria Boots.  She verbalized understanding and is aware rx sent to [facility] with 1 refill.  Pt voiced no further questions or concerns at this time and iis to call back if symptoms do not improve or worsen and is to seek emergency care if needed.

## 2014-04-19 NOTE — Telephone Encounter (Signed)
Called and spoke with pt and she stated that she feels like she has a sinus infection x 1 week.  She has been having sinus Headache and at times when she blows her nose she has blood in it.  She stated that she has been using mucinex, migraine otc meds and sudafed and these have not helped her.  Pt is requesting that something be sent in to her pharmacy.  CY please advise. Thanks  Last ov---06/01/13 Next ov---06/03/14  Allergies  Allergen Reactions  . Dextromethorphan Other (See Comments)    Severe hot feeling, sweating, flushed,incoherent  . Codeine   . Darvon   . Propoxyphene Hcl     Current Outpatient Prescriptions on File Prior to Visit  Medication Sig Dispense Refill  . azithromycin (ZITHROMAX) 250 MG tablet Take as directed  6 tablet  0  . Cholecalciferol (VITAMIN D3) 3000 UNITS TABS Take 4,000 Units by mouth daily.       . Multiple Vitamins-Minerals (CENTRUM SILVER) tablet Take 1 tablet by mouth daily.      . phenylephrine (SUDAFED PE) 10 MG TABS Take 10 mg by mouth every 4 (four) hours as needed.        . simvastatin (ZOCOR) 20 MG tablet Take 20 mg by mouth at bedtime.        . vitamin C (ASCORBIC ACID) 500 MG tablet Take 500 mg by mouth daily.      . [DISCONTINUED] mometasone (NASONEX) 50 MCG/ACT nasal spray Place 2 sprays into the nose daily. At bedtime  17 g  2   No current facility-administered medications on file prior to visit.

## 2014-04-19 NOTE — Telephone Encounter (Signed)
Offer augmentin 875, # 14, 1 bid, ref x 1

## 2014-04-23 DIAGNOSIS — H16049 Marginal corneal ulcer, unspecified eye: Secondary | ICD-10-CM | POA: Diagnosis not present

## 2014-04-23 DIAGNOSIS — H5210 Myopia, unspecified eye: Secondary | ICD-10-CM | POA: Diagnosis not present

## 2014-04-24 ENCOUNTER — Encounter: Payer: Self-pay | Admitting: Internal Medicine

## 2014-04-25 ENCOUNTER — Ambulatory Visit (INDEPENDENT_AMBULATORY_CARE_PROVIDER_SITE_OTHER): Payer: Medicare Other

## 2014-04-25 DIAGNOSIS — J309 Allergic rhinitis, unspecified: Secondary | ICD-10-CM

## 2014-05-01 DIAGNOSIS — Z01419 Encounter for gynecological examination (general) (routine) without abnormal findings: Secondary | ICD-10-CM | POA: Diagnosis not present

## 2014-05-01 DIAGNOSIS — Z124 Encounter for screening for malignant neoplasm of cervix: Secondary | ICD-10-CM | POA: Diagnosis not present

## 2014-05-02 ENCOUNTER — Ambulatory Visit (INDEPENDENT_AMBULATORY_CARE_PROVIDER_SITE_OTHER): Payer: Medicare Other

## 2014-05-02 DIAGNOSIS — J309 Allergic rhinitis, unspecified: Secondary | ICD-10-CM

## 2014-05-03 DIAGNOSIS — H16049 Marginal corneal ulcer, unspecified eye: Secondary | ICD-10-CM | POA: Diagnosis not present

## 2014-05-06 ENCOUNTER — Encounter: Payer: Self-pay | Admitting: Internal Medicine

## 2014-05-09 ENCOUNTER — Ambulatory Visit (INDEPENDENT_AMBULATORY_CARE_PROVIDER_SITE_OTHER): Payer: Medicare Other

## 2014-05-09 DIAGNOSIS — J309 Allergic rhinitis, unspecified: Secondary | ICD-10-CM

## 2014-05-14 ENCOUNTER — Telehealth: Payer: Self-pay | Admitting: Internal Medicine

## 2014-05-14 MED ORDER — BENZONATATE 200 MG PO CAPS: 200.0000 mg | ORAL_CAPSULE | Freq: Three times a day (TID) | ORAL | Status: AC | PRN

## 2014-05-14 NOTE — Telephone Encounter (Signed)
Ok Tessalon 200 mg, # 30, 1 every 8 hours prn cough, refill x 1

## 2014-05-14 NOTE — Telephone Encounter (Signed)
Pt aware of recs.  Nothing further needed. 

## 2014-05-14 NOTE — Telephone Encounter (Signed)
[facility] PT line rang numerous times, no answer, no VM WCB

## 2014-05-14 NOTE — Telephone Encounter (Signed)
Spoke with patient-she states she had a sinus infection about 3 weeks ago; since then she has started to have a cough (dry this am) and wanted to know if CY will send Rx for Tessalon for her. Pt has Augmentin Rx on hand from previous Rx given with refill that she will take with her. Pt is set to go to the beach on Monday 05-20-14 for about 7-10 days.    Please advise CY. Thanks.  Allergies  Allergen Reactions  . Dextromethorphan Other (See Comments)    Severe hot feeling, sweating, flushed,incoherent  . Codeine   . Darvon   . Propoxyphene Hcl

## 2014-05-16 ENCOUNTER — Ambulatory Visit (INDEPENDENT_AMBULATORY_CARE_PROVIDER_SITE_OTHER): Payer: Medicare Other

## 2014-05-16 DIAGNOSIS — J309 Allergic rhinitis, unspecified: Secondary | ICD-10-CM

## 2014-05-30 ENCOUNTER — Ambulatory Visit (INDEPENDENT_AMBULATORY_CARE_PROVIDER_SITE_OTHER): Payer: Medicare Other

## 2014-05-30 DIAGNOSIS — J309 Allergic rhinitis, unspecified: Secondary | ICD-10-CM

## 2014-06-03 ENCOUNTER — Encounter: Payer: Self-pay | Admitting: Internal Medicine

## 2014-06-03 ENCOUNTER — Ambulatory Visit: Payer: Medicare Other | Admitting: Internal Medicine

## 2014-06-03 VITALS — BP 114/70 | HR 72 | Ht 60.0 in | Wt 162.6 lb

## 2014-06-03 DIAGNOSIS — J301 Allergic rhinitis due to pollen: Secondary | ICD-10-CM

## 2014-06-03 NOTE — Progress Notes (Signed)
Patient ID: [name] [name], female    DOB: June 04, 1951, 62 y.o.   MRN: [id number]  HPI 05/13/11- 63 year old female never smoker followed for allergic rhinitis, lung nodules on chest x-ray/2010 Last here -05/01/2010 Wants to wait on flu shot. Has some nasal congestion. Was treated for sinusitis in August with Sudafed and Mucinex. We gave Augmentin and 5 days of prednisone. She feels nothing to blow out of her nose now. Chest is clear. Denies headache chest pain fever sore throat or nodes. Continues allergy vaccine.  10/29/11- 63 year old female never smoker followed for allergic rhinitis, lung nodules on chest x-ray/2010 Acute visit-sinus congestion as well as chest congestion; cough-prod-not much color; slight sneezing but rare-able to blow nose and no color. For 2 weeks has had head congestion extending into chest. Initially sore throat, now gone. Mucus clear with some brown. Feels stopped up. Chest tight without wheeze or cough. Taking Mucinex D each morning, Sudafed each evening.    CT chest 05/28/11-  Images reviewed w/ her-    IMPRESSION:  1. Stable 5 mm right upper lobe nodule, consistent with benign  etiology.  2. No active disease or other significant abnormality identified.  Original Report Authenticated By: Marlaine Hind, M.D.   05/19/12- 63 year old female never smoker followed for allergic rhinitis, lung nodules on chest x-ray/2010  Pt states doing well, still taking allergy vaccine 1:10 GH.  Nasal congestion required medications through the wet weather but is much better now. Some pain at the opening of left nostril. Steroid nasal sprays all over -dried her. Uses saline gel.  Will get allergy vaccine elsewhere. Lung nodule appears benign reviewed from CT scan in October, 2012.  08/17/12- 64 year old female never smoker followed for allergic rhinitis, lung nodules on chest x-ray/2010 chest congestion, cough with a [place] amount of clear to yellow mucus, fatigue, wheezing, and f/c/s.   Symptoms started x 1 wk ago. Acute onset cough, fever, chills, yellow sputum, nasal congestion. No stomach upset. Little sore throat. Has had flu vaccine. Taking Mucinex D. CT chest 05/28/11- IMPRESSION:  1. Stable 5 mm right upper lobe nodule, consistent with benign  etiology.  2. No active disease or other significant abnormality identified.  Original Report Authenticated By: Marlaine Hind, M.D.   06/01/13- 63 year old female never smoker followed for allergic rhinitis, lung nodules on chest x-ray/2010 FOLLOWS FOR: still on Allergy vaccine 1:10 GH and doing well. Wants to wait on flu shot-discussed. Rare need for antihistamine. Uses saline nasal spray or gel.  06/03/14-63 year old female never smoker followed for allergic rhinitis, lung nodules on chest x-ray/2010 FOLLOWS FOR: still on Allergy vaccine 1:10 GH- having stuffiness today; throbing headaches, eyes feel warm and feels weak.   Review of Systems-see HPI Constitutional:   No-   weight loss, night sweats,no- fevers, no-chills, fatigue, lassitude. HEENT:   No-  headaches, difficulty swallowing, tooth/dental problems, sore throat,       No-  sneezing, itching, ear ache, +nasal congestion, post nasal drip,  CV:  No-   chest pain, orthopnea, PND, swelling in lower extremities, anasarca, dizziness, palpitations Resp: No-   shortness of breath with exertion or at rest.             No-  productive cough,  No non-productive cough,  No-  coughing up of blood.             No- change in color of mucus.  No- wheezing.   Skin: No-   rash or lesions. GI:  No-  heartburn, indigestion, abdominal pain, nausea, vomiting,  GU:  MS:  No-   joint pain or swelling.  . Neuro- grossly normal  Psych:  No- change in mood or affect. No depression or anxiety.  No memory loss.  Objective:   Physical Exam General- Alert, Oriented, Affect-appropriate, Distress- none acute Skin- rash-none, lesions- none, excoriation- none. Fair complected, question  rosacea. Lymphadenopathy- none Head- atraumatic            Eyes- Gross vision intact, PERRLA, conjunctivae clear secretions            Ears- Hearing, canals-normal            Nose-  no-Septal dev, mucus+ minor crusting, no-polyps, erosion, perforation             Throat- Mallampati III , +mucosa red without exudate , drainage- none, tonsils- atrophic Neck- flexible , trachea midline, no stridor , thyroid nl, carotid no bruit Chest - symmetrical excursion , unlabored           Heart/CV- RRR , no murmur , no gallop  , no rub, nl s1 s2                           - JVD- none , edema- none, stasis changes- none, varices- none           Lung- clear to P&A, wheeze- none,  coughnon- , dullness-none, rub- none           Chest wall-  Abd-  Br/ Gen/ Rectal- Not done, not indicated Extrem- cyanosis- none, clubbing, none, atrophy- none, strength- nl Neuro- grossly intact to observation

## 2014-06-03 NOTE — Patient Instructions (Addendum)
Sample Dymista nasal spray    1-2 puffs each nostril once daily at bedtime  You can use nasal saline gel as much as you want  Ok to use a topical antibiotic like Neosporin or Polysporin for a few day you have sores in your nose  Don't forget that flu shot  We can continue allergy vaccine 1:10 GH

## 2014-06-06 ENCOUNTER — Ambulatory Visit (INDEPENDENT_AMBULATORY_CARE_PROVIDER_SITE_OTHER): Payer: Medicare Other

## 2014-06-06 DIAGNOSIS — J309 Allergic rhinitis, unspecified: Secondary | ICD-10-CM

## 2014-06-13 ENCOUNTER — Ambulatory Visit (INDEPENDENT_AMBULATORY_CARE_PROVIDER_SITE_OTHER): Payer: Medicare Other

## 2014-06-13 DIAGNOSIS — Z23 Encounter for immunization: Secondary | ICD-10-CM

## 2014-06-18 ENCOUNTER — Ambulatory Visit (INDEPENDENT_AMBULATORY_CARE_PROVIDER_SITE_OTHER): Payer: Medicare Other

## 2014-06-18 DIAGNOSIS — J309 Allergic rhinitis, unspecified: Secondary | ICD-10-CM | POA: Diagnosis not present

## 2014-06-20 ENCOUNTER — Ambulatory Visit (INDEPENDENT_AMBULATORY_CARE_PROVIDER_SITE_OTHER): Payer: Medicare Other

## 2014-06-20 DIAGNOSIS — J309 Allergic rhinitis, unspecified: Secondary | ICD-10-CM | POA: Diagnosis not present

## 2014-06-27 ENCOUNTER — Ambulatory Visit (INDEPENDENT_AMBULATORY_CARE_PROVIDER_SITE_OTHER): Payer: Medicare Other

## 2014-06-27 DIAGNOSIS — J309 Allergic rhinitis, unspecified: Secondary | ICD-10-CM

## 2014-07-04 ENCOUNTER — Ambulatory Visit (INDEPENDENT_AMBULATORY_CARE_PROVIDER_SITE_OTHER): Payer: Medicare Other

## 2014-07-04 DIAGNOSIS — J309 Allergic rhinitis, unspecified: Secondary | ICD-10-CM

## 2014-07-05 ENCOUNTER — Encounter: Payer: Self-pay | Admitting: Internal Medicine

## 2014-07-11 ENCOUNTER — Ambulatory Visit: Payer: Medicare Other

## 2014-07-15 ENCOUNTER — Encounter: Payer: Self-pay | Admitting: Internal Medicine

## 2014-07-15 ENCOUNTER — Telehealth: Payer: Self-pay | Admitting: Internal Medicine

## 2014-07-15 MED ORDER — DOXYCYCLINE HYCLATE 100 MG PO TABS
ORAL_TABLET | ORAL | Status: DC
Start: 2014-07-15 — End: 2015-08-11

## 2014-07-15 NOTE — Telephone Encounter (Signed)
PT has been on Augmentin for 6 days and has 4 pills left.  Still c/o sorethroat, neck glands hurt, sinus drainage and just started with some diarrhea.  Denies fever.  Please advise.  Allergies  Allergen Reactions  . Dextromethorphan Other (See Comments)    Severe hot feeling, sweating, flushed,incoherent  . Codeine   . Darvon   . Propoxyphene Hcl     Current Outpatient Prescriptions on File Prior to Visit  Medication Sig Dispense Refill  . benzonatate (TESSALON) 200 MG capsule Take 1 capsule (200 mg total) by mouth every 8 (eight) hours as needed for cough. 30 capsule 1  . Cholecalciferol (VITAMIN D3) 3000 UNITS TABS Take 4,000 Units by mouth daily.     . Multiple Vitamins-Minerals (CENTRUM SILVER) tablet Take 1 tablet by mouth daily.    . phenylephrine (SUDAFED PE) 10 MG TABS Take 10 mg by mouth every 4 (four) hours as needed.      . simvastatin (ZOCOR) 20 MG tablet Take 20 mg by mouth at bedtime.      . vitamin C (ASCORBIC ACID) 500 MG tablet Take 500 mg by mouth daily.    . [DISCONTINUED] mometasone (NASONEX) 50 MCG/ACT nasal spray Place 2 sprays into the nose daily. At bedtime 17 g 2   No current facility-administered medications on file prior to visit.

## 2014-07-15 NOTE — Telephone Encounter (Signed)
Ok to extend antibiotic- finish augmentin then change to doxycycline 100 mg, # 8, 2 today then one daily.  Hope "diarrhea" is not going to turn into C.diff from antibiotics. If it gets bad, let us know. Suggest now adding a probiotic like [employer].

## 2014-07-15 NOTE — Telephone Encounter (Signed)
Spoke to pt and advised of Dr Janee Morn recommendations.  Rx sent.

## 2014-07-16 ENCOUNTER — Ambulatory Visit: Payer: Medicare Other

## 2014-07-23 ENCOUNTER — Ambulatory Visit (INDEPENDENT_AMBULATORY_CARE_PROVIDER_SITE_OTHER): Payer: Medicare Other

## 2014-07-23 DIAGNOSIS — J309 Allergic rhinitis, unspecified: Secondary | ICD-10-CM | POA: Diagnosis not present

## 2014-07-30 ENCOUNTER — Ambulatory Visit (INDEPENDENT_AMBULATORY_CARE_PROVIDER_SITE_OTHER): Payer: Medicare Other

## 2014-07-30 DIAGNOSIS — M8580 Other specified disorders of bone density and structure, unspecified site: Secondary | ICD-10-CM | POA: Diagnosis not present

## 2014-07-30 DIAGNOSIS — J309 Allergic rhinitis, unspecified: Secondary | ICD-10-CM

## 2014-07-30 DIAGNOSIS — E559 Vitamin D deficiency, unspecified: Secondary | ICD-10-CM | POA: Diagnosis not present

## 2014-08-06 ENCOUNTER — Ambulatory Visit (INDEPENDENT_AMBULATORY_CARE_PROVIDER_SITE_OTHER): Payer: Medicare Other

## 2014-08-06 DIAGNOSIS — J309 Allergic rhinitis, unspecified: Secondary | ICD-10-CM | POA: Diagnosis not present

## 2014-08-13 ENCOUNTER — Ambulatory Visit (INDEPENDENT_AMBULATORY_CARE_PROVIDER_SITE_OTHER): Payer: Medicare Other

## 2014-08-13 DIAGNOSIS — J309 Allergic rhinitis, unspecified: Secondary | ICD-10-CM

## 2014-08-20 ENCOUNTER — Ambulatory Visit (INDEPENDENT_AMBULATORY_CARE_PROVIDER_SITE_OTHER): Payer: Medicare Other

## 2014-08-20 DIAGNOSIS — J309 Allergic rhinitis, unspecified: Secondary | ICD-10-CM

## 2014-08-27 ENCOUNTER — Ambulatory Visit (INDEPENDENT_AMBULATORY_CARE_PROVIDER_SITE_OTHER): Payer: Medicare Other

## 2014-08-27 DIAGNOSIS — J309 Allergic rhinitis, unspecified: Secondary | ICD-10-CM

## 2014-08-28 ENCOUNTER — Encounter: Payer: Self-pay | Admitting: Internal Medicine

## 2014-09-03 ENCOUNTER — Ambulatory Visit (INDEPENDENT_AMBULATORY_CARE_PROVIDER_SITE_OTHER): Payer: Medicare Other

## 2014-09-03 DIAGNOSIS — J309 Allergic rhinitis, unspecified: Secondary | ICD-10-CM | POA: Diagnosis not present

## 2014-09-10 ENCOUNTER — Ambulatory Visit (INDEPENDENT_AMBULATORY_CARE_PROVIDER_SITE_OTHER): Payer: Medicare Other

## 2014-09-10 DIAGNOSIS — J309 Allergic rhinitis, unspecified: Secondary | ICD-10-CM

## 2014-09-17 ENCOUNTER — Ambulatory Visit (INDEPENDENT_AMBULATORY_CARE_PROVIDER_SITE_OTHER): Payer: Medicare Other

## 2014-09-17 DIAGNOSIS — J309 Allergic rhinitis, unspecified: Secondary | ICD-10-CM

## 2014-09-24 ENCOUNTER — Ambulatory Visit (INDEPENDENT_AMBULATORY_CARE_PROVIDER_SITE_OTHER): Payer: Medicare Other

## 2014-09-24 DIAGNOSIS — J309 Allergic rhinitis, unspecified: Secondary | ICD-10-CM

## 2014-10-01 ENCOUNTER — Ambulatory Visit (INDEPENDENT_AMBULATORY_CARE_PROVIDER_SITE_OTHER): Payer: Medicare Other

## 2014-10-01 DIAGNOSIS — J309 Allergic rhinitis, unspecified: Secondary | ICD-10-CM

## 2014-10-02 ENCOUNTER — Ambulatory Visit (INDEPENDENT_AMBULATORY_CARE_PROVIDER_SITE_OTHER): Payer: Medicare Other

## 2014-10-02 DIAGNOSIS — J309 Allergic rhinitis, unspecified: Secondary | ICD-10-CM

## 2014-10-08 ENCOUNTER — Ambulatory Visit (INDEPENDENT_AMBULATORY_CARE_PROVIDER_SITE_OTHER): Payer: Medicare Other

## 2014-10-08 DIAGNOSIS — J309 Allergic rhinitis, unspecified: Secondary | ICD-10-CM

## 2014-10-15 ENCOUNTER — Ambulatory Visit (INDEPENDENT_AMBULATORY_CARE_PROVIDER_SITE_OTHER): Payer: Medicare Other

## 2014-10-15 DIAGNOSIS — J309 Allergic rhinitis, unspecified: Secondary | ICD-10-CM

## 2014-10-17 ENCOUNTER — Telehealth: Payer: Self-pay | Admitting: Internal Medicine

## 2014-10-17 MED ORDER — AZITHROMYCIN 250 MG PO TABS: ORAL_TABLET | ORAL | Status: DC

## 2014-10-17 NOTE — Telephone Encounter (Signed)
Pt aware of recs.  Med sent in.  Message forwarded to tammy scott and to [place].  Nothing further needed.

## 2014-10-17 NOTE — Telephone Encounter (Signed)
Spoke with pt, states she has a sinus infection.  Pt c/o sinus congestion, pnd X4 weeks.  Pt having thick yellow/green/clear mucus, headaches, fatigue.  Has been using neti pot, saline nasal spray.  Denies fever.  Pt uses [facility] on [address]    Also, pt had allergy injection on Tuesday, left arm was sore, pink mark at injection site about 2-50 cent pieces.  Pt had no reaction to injection on R arm.    Last ov: 06/03/14 Next ov: 06/02/15  CY please advise on recs.  Thank you.   Allergies  Allergen Reactions  . Dextromethorphan Other (See Comments)    Severe hot feeling, sweating, flushed,incoherent  . Codeine   . Darvon   . Propoxyphene Hcl    Current Outpatient Prescriptions on File Prior to Visit  Medication Sig Dispense Refill  . benzonatate (TESSALON) 200 MG capsule Take 1 capsule (200 mg total) by mouth every 8 (eight) hours as needed for cough. 30 capsule 1  . Cholecalciferol (VITAMIN D3) 3000 UNITS TABS Take 4,000 Units by mouth daily.     Marland Kitchen doxycycline (VIBRA-TABS) 100 MG tablet Take 2 tablets today then 1 tablet daily until gone. 8 tablet 0  . Multiple Vitamins-Minerals (CENTRUM SILVER) tablet Take 1 tablet by mouth daily.    . phenylephrine (SUDAFED PE) 10 MG TABS Take 10 mg by mouth every 4 (four) hours as needed.      . simvastatin (ZOCOR) 20 MG tablet Take 20 mg by mouth at bedtime.      . vitamin C (ASCORBIC ACID) 500 MG tablet Take 500 mg by mouth daily.    . [DISCONTINUED] mometasone (NASONEX) 50 MCG/ACT nasal spray Place 2 sprays into the nose daily. At bedtime 17 g 2   No current facility-administered medications on file prior to visit.

## 2014-10-17 NOTE — Telephone Encounter (Signed)
Per CY-lets offer her a Zpak and then send message to Alroy Bailiff for allergy memo.   Alroy Bailiff, we will need to back patient down to 0.1 of vial B and build up as tolerated. Thanks.

## 2014-10-21 NOTE — Telephone Encounter (Signed)
Noted  

## 2014-10-22 ENCOUNTER — Ambulatory Visit (INDEPENDENT_AMBULATORY_CARE_PROVIDER_SITE_OTHER): Payer: Medicare Other

## 2014-10-22 DIAGNOSIS — J309 Allergic rhinitis, unspecified: Secondary | ICD-10-CM

## 2014-10-29 ENCOUNTER — Ambulatory Visit (INDEPENDENT_AMBULATORY_CARE_PROVIDER_SITE_OTHER): Payer: Medicare Other

## 2014-10-29 DIAGNOSIS — J309 Allergic rhinitis, unspecified: Secondary | ICD-10-CM

## 2014-11-01 ENCOUNTER — Encounter: Payer: Self-pay | Admitting: Internal Medicine

## 2014-11-02 DIAGNOSIS — M25572 Pain in left ankle and joints of left foot: Secondary | ICD-10-CM | POA: Diagnosis not present

## 2014-11-05 ENCOUNTER — Ambulatory Visit (INDEPENDENT_AMBULATORY_CARE_PROVIDER_SITE_OTHER): Payer: Medicare Other

## 2014-11-05 DIAGNOSIS — J309 Allergic rhinitis, unspecified: Secondary | ICD-10-CM | POA: Diagnosis not present

## 2014-11-12 ENCOUNTER — Ambulatory Visit (INDEPENDENT_AMBULATORY_CARE_PROVIDER_SITE_OTHER): Payer: Medicare Other

## 2014-11-12 DIAGNOSIS — J309 Allergic rhinitis, unspecified: Secondary | ICD-10-CM | POA: Diagnosis not present

## 2014-11-19 ENCOUNTER — Ambulatory Visit (INDEPENDENT_AMBULATORY_CARE_PROVIDER_SITE_OTHER): Payer: Medicare Other

## 2014-11-19 DIAGNOSIS — J309 Allergic rhinitis, unspecified: Secondary | ICD-10-CM | POA: Diagnosis not present

## 2014-11-26 ENCOUNTER — Ambulatory Visit (INDEPENDENT_AMBULATORY_CARE_PROVIDER_SITE_OTHER): Payer: Medicare Other

## 2014-11-26 DIAGNOSIS — M5137 Other intervertebral disc degeneration, lumbosacral region: Secondary | ICD-10-CM | POA: Diagnosis not present

## 2014-11-26 DIAGNOSIS — Q72812 Congenital shortening of left lower limb: Secondary | ICD-10-CM | POA: Diagnosis not present

## 2014-11-26 DIAGNOSIS — M9903 Segmental and somatic dysfunction of lumbar region: Secondary | ICD-10-CM | POA: Diagnosis not present

## 2014-11-26 DIAGNOSIS — J309 Allergic rhinitis, unspecified: Secondary | ICD-10-CM | POA: Diagnosis not present

## 2014-11-26 DIAGNOSIS — M5116 Intervertebral disc disorders with radiculopathy, lumbar region: Secondary | ICD-10-CM | POA: Diagnosis not present

## 2014-11-26 DIAGNOSIS — M9904 Segmental and somatic dysfunction of sacral region: Secondary | ICD-10-CM | POA: Diagnosis not present

## 2014-11-26 DIAGNOSIS — M9905 Segmental and somatic dysfunction of pelvic region: Secondary | ICD-10-CM | POA: Diagnosis not present

## 2014-11-28 DIAGNOSIS — M5137 Other intervertebral disc degeneration, lumbosacral region: Secondary | ICD-10-CM | POA: Diagnosis not present

## 2014-11-28 DIAGNOSIS — M9903 Segmental and somatic dysfunction of lumbar region: Secondary | ICD-10-CM | POA: Diagnosis not present

## 2014-11-28 DIAGNOSIS — Z1231 Encounter for screening mammogram for malignant neoplasm of breast: Secondary | ICD-10-CM | POA: Diagnosis not present

## 2014-11-28 DIAGNOSIS — Q72812 Congenital shortening of left lower limb: Secondary | ICD-10-CM | POA: Diagnosis not present

## 2014-11-28 DIAGNOSIS — M9904 Segmental and somatic dysfunction of sacral region: Secondary | ICD-10-CM | POA: Diagnosis not present

## 2014-11-28 DIAGNOSIS — M9905 Segmental and somatic dysfunction of pelvic region: Secondary | ICD-10-CM | POA: Diagnosis not present

## 2014-11-28 DIAGNOSIS — M5116 Intervertebral disc disorders with radiculopathy, lumbar region: Secondary | ICD-10-CM | POA: Diagnosis not present

## 2014-12-02 DIAGNOSIS — M9905 Segmental and somatic dysfunction of pelvic region: Secondary | ICD-10-CM | POA: Diagnosis not present

## 2014-12-02 DIAGNOSIS — M5116 Intervertebral disc disorders with radiculopathy, lumbar region: Secondary | ICD-10-CM | POA: Diagnosis not present

## 2014-12-02 DIAGNOSIS — M9903 Segmental and somatic dysfunction of lumbar region: Secondary | ICD-10-CM | POA: Diagnosis not present

## 2014-12-02 DIAGNOSIS — M9904 Segmental and somatic dysfunction of sacral region: Secondary | ICD-10-CM | POA: Diagnosis not present

## 2014-12-02 DIAGNOSIS — Q72812 Congenital shortening of left lower limb: Secondary | ICD-10-CM | POA: Diagnosis not present

## 2014-12-02 DIAGNOSIS — M5137 Other intervertebral disc degeneration, lumbosacral region: Secondary | ICD-10-CM | POA: Diagnosis not present

## 2014-12-03 ENCOUNTER — Ambulatory Visit (INDEPENDENT_AMBULATORY_CARE_PROVIDER_SITE_OTHER): Payer: Medicare Other

## 2014-12-03 DIAGNOSIS — M5116 Intervertebral disc disorders with radiculopathy, lumbar region: Secondary | ICD-10-CM | POA: Diagnosis not present

## 2014-12-03 DIAGNOSIS — M9903 Segmental and somatic dysfunction of lumbar region: Secondary | ICD-10-CM | POA: Diagnosis not present

## 2014-12-03 DIAGNOSIS — M9904 Segmental and somatic dysfunction of sacral region: Secondary | ICD-10-CM | POA: Diagnosis not present

## 2014-12-03 DIAGNOSIS — Q72812 Congenital shortening of left lower limb: Secondary | ICD-10-CM | POA: Diagnosis not present

## 2014-12-03 DIAGNOSIS — M9905 Segmental and somatic dysfunction of pelvic region: Secondary | ICD-10-CM | POA: Diagnosis not present

## 2014-12-03 DIAGNOSIS — M5137 Other intervertebral disc degeneration, lumbosacral region: Secondary | ICD-10-CM | POA: Diagnosis not present

## 2014-12-03 DIAGNOSIS — J309 Allergic rhinitis, unspecified: Secondary | ICD-10-CM

## 2014-12-05 DIAGNOSIS — Q72812 Congenital shortening of left lower limb: Secondary | ICD-10-CM | POA: Diagnosis not present

## 2014-12-05 DIAGNOSIS — M5137 Other intervertebral disc degeneration, lumbosacral region: Secondary | ICD-10-CM | POA: Diagnosis not present

## 2014-12-05 DIAGNOSIS — M5116 Intervertebral disc disorders with radiculopathy, lumbar region: Secondary | ICD-10-CM | POA: Diagnosis not present

## 2014-12-05 DIAGNOSIS — M9904 Segmental and somatic dysfunction of sacral region: Secondary | ICD-10-CM | POA: Diagnosis not present

## 2014-12-05 DIAGNOSIS — M9903 Segmental and somatic dysfunction of lumbar region: Secondary | ICD-10-CM | POA: Diagnosis not present

## 2014-12-05 DIAGNOSIS — M9905 Segmental and somatic dysfunction of pelvic region: Secondary | ICD-10-CM | POA: Diagnosis not present

## 2014-12-09 DIAGNOSIS — Q72812 Congenital shortening of left lower limb: Secondary | ICD-10-CM | POA: Diagnosis not present

## 2014-12-09 DIAGNOSIS — M5137 Other intervertebral disc degeneration, lumbosacral region: Secondary | ICD-10-CM | POA: Diagnosis not present

## 2014-12-09 DIAGNOSIS — M9905 Segmental and somatic dysfunction of pelvic region: Secondary | ICD-10-CM | POA: Diagnosis not present

## 2014-12-09 DIAGNOSIS — M5116 Intervertebral disc disorders with radiculopathy, lumbar region: Secondary | ICD-10-CM | POA: Diagnosis not present

## 2014-12-09 DIAGNOSIS — M9904 Segmental and somatic dysfunction of sacral region: Secondary | ICD-10-CM | POA: Diagnosis not present

## 2014-12-09 DIAGNOSIS — M9903 Segmental and somatic dysfunction of lumbar region: Secondary | ICD-10-CM | POA: Diagnosis not present

## 2014-12-10 ENCOUNTER — Ambulatory Visit (INDEPENDENT_AMBULATORY_CARE_PROVIDER_SITE_OTHER): Payer: Medicare Other

## 2014-12-10 DIAGNOSIS — J309 Allergic rhinitis, unspecified: Secondary | ICD-10-CM | POA: Diagnosis not present

## 2014-12-10 DIAGNOSIS — M9903 Segmental and somatic dysfunction of lumbar region: Secondary | ICD-10-CM | POA: Diagnosis not present

## 2014-12-10 DIAGNOSIS — M5116 Intervertebral disc disorders with radiculopathy, lumbar region: Secondary | ICD-10-CM | POA: Diagnosis not present

## 2014-12-10 DIAGNOSIS — Q72812 Congenital shortening of left lower limb: Secondary | ICD-10-CM | POA: Diagnosis not present

## 2014-12-10 DIAGNOSIS — M9904 Segmental and somatic dysfunction of sacral region: Secondary | ICD-10-CM | POA: Diagnosis not present

## 2014-12-10 DIAGNOSIS — M5137 Other intervertebral disc degeneration, lumbosacral region: Secondary | ICD-10-CM | POA: Diagnosis not present

## 2014-12-10 DIAGNOSIS — M9905 Segmental and somatic dysfunction of pelvic region: Secondary | ICD-10-CM | POA: Diagnosis not present

## 2014-12-12 DIAGNOSIS — M9904 Segmental and somatic dysfunction of sacral region: Secondary | ICD-10-CM | POA: Diagnosis not present

## 2014-12-12 DIAGNOSIS — M5137 Other intervertebral disc degeneration, lumbosacral region: Secondary | ICD-10-CM | POA: Diagnosis not present

## 2014-12-12 DIAGNOSIS — M9903 Segmental and somatic dysfunction of lumbar region: Secondary | ICD-10-CM | POA: Diagnosis not present

## 2014-12-12 DIAGNOSIS — Q72812 Congenital shortening of left lower limb: Secondary | ICD-10-CM | POA: Diagnosis not present

## 2014-12-12 DIAGNOSIS — M5116 Intervertebral disc disorders with radiculopathy, lumbar region: Secondary | ICD-10-CM | POA: Diagnosis not present

## 2014-12-12 DIAGNOSIS — M9905 Segmental and somatic dysfunction of pelvic region: Secondary | ICD-10-CM | POA: Diagnosis not present

## 2014-12-13 ENCOUNTER — Encounter: Payer: Self-pay | Admitting: Internal Medicine

## 2014-12-16 DIAGNOSIS — M9903 Segmental and somatic dysfunction of lumbar region: Secondary | ICD-10-CM | POA: Diagnosis not present

## 2014-12-16 DIAGNOSIS — M9905 Segmental and somatic dysfunction of pelvic region: Secondary | ICD-10-CM | POA: Diagnosis not present

## 2014-12-16 DIAGNOSIS — M9904 Segmental and somatic dysfunction of sacral region: Secondary | ICD-10-CM | POA: Diagnosis not present

## 2014-12-16 DIAGNOSIS — Q72812 Congenital shortening of left lower limb: Secondary | ICD-10-CM | POA: Diagnosis not present

## 2014-12-16 DIAGNOSIS — M5137 Other intervertebral disc degeneration, lumbosacral region: Secondary | ICD-10-CM | POA: Diagnosis not present

## 2014-12-16 DIAGNOSIS — M5116 Intervertebral disc disorders with radiculopathy, lumbar region: Secondary | ICD-10-CM | POA: Diagnosis not present

## 2014-12-17 ENCOUNTER — Ambulatory Visit (INDEPENDENT_AMBULATORY_CARE_PROVIDER_SITE_OTHER): Payer: Medicare Other

## 2014-12-17 DIAGNOSIS — M9903 Segmental and somatic dysfunction of lumbar region: Secondary | ICD-10-CM | POA: Diagnosis not present

## 2014-12-17 DIAGNOSIS — M9904 Segmental and somatic dysfunction of sacral region: Secondary | ICD-10-CM | POA: Diagnosis not present

## 2014-12-17 DIAGNOSIS — Q72812 Congenital shortening of left lower limb: Secondary | ICD-10-CM | POA: Diagnosis not present

## 2014-12-17 DIAGNOSIS — M9905 Segmental and somatic dysfunction of pelvic region: Secondary | ICD-10-CM | POA: Diagnosis not present

## 2014-12-17 DIAGNOSIS — J309 Allergic rhinitis, unspecified: Secondary | ICD-10-CM

## 2014-12-17 DIAGNOSIS — M5137 Other intervertebral disc degeneration, lumbosacral region: Secondary | ICD-10-CM | POA: Diagnosis not present

## 2014-12-17 DIAGNOSIS — M5116 Intervertebral disc disorders with radiculopathy, lumbar region: Secondary | ICD-10-CM | POA: Diagnosis not present

## 2014-12-19 DIAGNOSIS — M5116 Intervertebral disc disorders with radiculopathy, lumbar region: Secondary | ICD-10-CM | POA: Diagnosis not present

## 2014-12-19 DIAGNOSIS — M9904 Segmental and somatic dysfunction of sacral region: Secondary | ICD-10-CM | POA: Diagnosis not present

## 2014-12-19 DIAGNOSIS — M5137 Other intervertebral disc degeneration, lumbosacral region: Secondary | ICD-10-CM | POA: Diagnosis not present

## 2014-12-19 DIAGNOSIS — M9903 Segmental and somatic dysfunction of lumbar region: Secondary | ICD-10-CM | POA: Diagnosis not present

## 2014-12-19 DIAGNOSIS — Q72812 Congenital shortening of left lower limb: Secondary | ICD-10-CM | POA: Diagnosis not present

## 2014-12-19 DIAGNOSIS — M9905 Segmental and somatic dysfunction of pelvic region: Secondary | ICD-10-CM | POA: Diagnosis not present

## 2014-12-23 DIAGNOSIS — M9903 Segmental and somatic dysfunction of lumbar region: Secondary | ICD-10-CM | POA: Diagnosis not present

## 2014-12-23 DIAGNOSIS — Q72812 Congenital shortening of left lower limb: Secondary | ICD-10-CM | POA: Diagnosis not present

## 2014-12-23 DIAGNOSIS — M5137 Other intervertebral disc degeneration, lumbosacral region: Secondary | ICD-10-CM | POA: Diagnosis not present

## 2014-12-23 DIAGNOSIS — M9904 Segmental and somatic dysfunction of sacral region: Secondary | ICD-10-CM | POA: Diagnosis not present

## 2014-12-23 DIAGNOSIS — M9905 Segmental and somatic dysfunction of pelvic region: Secondary | ICD-10-CM | POA: Diagnosis not present

## 2014-12-23 DIAGNOSIS — M5116 Intervertebral disc disorders with radiculopathy, lumbar region: Secondary | ICD-10-CM | POA: Diagnosis not present

## 2014-12-24 ENCOUNTER — Ambulatory Visit (INDEPENDENT_AMBULATORY_CARE_PROVIDER_SITE_OTHER): Payer: Medicare Other

## 2014-12-24 DIAGNOSIS — J309 Allergic rhinitis, unspecified: Secondary | ICD-10-CM | POA: Diagnosis not present

## 2014-12-26 DIAGNOSIS — M5116 Intervertebral disc disorders with radiculopathy, lumbar region: Secondary | ICD-10-CM | POA: Diagnosis not present

## 2014-12-26 DIAGNOSIS — M9903 Segmental and somatic dysfunction of lumbar region: Secondary | ICD-10-CM | POA: Diagnosis not present

## 2014-12-26 DIAGNOSIS — M5137 Other intervertebral disc degeneration, lumbosacral region: Secondary | ICD-10-CM | POA: Diagnosis not present

## 2014-12-26 DIAGNOSIS — M9904 Segmental and somatic dysfunction of sacral region: Secondary | ICD-10-CM | POA: Diagnosis not present

## 2014-12-26 DIAGNOSIS — M9905 Segmental and somatic dysfunction of pelvic region: Secondary | ICD-10-CM | POA: Diagnosis not present

## 2014-12-26 DIAGNOSIS — Q72812 Congenital shortening of left lower limb: Secondary | ICD-10-CM | POA: Diagnosis not present

## 2014-12-30 DIAGNOSIS — M9904 Segmental and somatic dysfunction of sacral region: Secondary | ICD-10-CM | POA: Diagnosis not present

## 2014-12-30 DIAGNOSIS — M9905 Segmental and somatic dysfunction of pelvic region: Secondary | ICD-10-CM | POA: Diagnosis not present

## 2014-12-30 DIAGNOSIS — M9903 Segmental and somatic dysfunction of lumbar region: Secondary | ICD-10-CM | POA: Diagnosis not present

## 2014-12-30 DIAGNOSIS — M5116 Intervertebral disc disorders with radiculopathy, lumbar region: Secondary | ICD-10-CM | POA: Diagnosis not present

## 2014-12-30 DIAGNOSIS — Q72812 Congenital shortening of left lower limb: Secondary | ICD-10-CM | POA: Diagnosis not present

## 2014-12-30 DIAGNOSIS — M5137 Other intervertebral disc degeneration, lumbosacral region: Secondary | ICD-10-CM | POA: Diagnosis not present

## 2014-12-31 ENCOUNTER — Ambulatory Visit (INDEPENDENT_AMBULATORY_CARE_PROVIDER_SITE_OTHER): Payer: Medicare Other

## 2014-12-31 DIAGNOSIS — J309 Allergic rhinitis, unspecified: Secondary | ICD-10-CM

## 2015-01-02 DIAGNOSIS — Q72812 Congenital shortening of left lower limb: Secondary | ICD-10-CM | POA: Diagnosis not present

## 2015-01-02 DIAGNOSIS — M9903 Segmental and somatic dysfunction of lumbar region: Secondary | ICD-10-CM | POA: Diagnosis not present

## 2015-01-02 DIAGNOSIS — M5137 Other intervertebral disc degeneration, lumbosacral region: Secondary | ICD-10-CM | POA: Diagnosis not present

## 2015-01-02 DIAGNOSIS — M5116 Intervertebral disc disorders with radiculopathy, lumbar region: Secondary | ICD-10-CM | POA: Diagnosis not present

## 2015-01-02 DIAGNOSIS — M9904 Segmental and somatic dysfunction of sacral region: Secondary | ICD-10-CM | POA: Diagnosis not present

## 2015-01-02 DIAGNOSIS — M9905 Segmental and somatic dysfunction of pelvic region: Secondary | ICD-10-CM | POA: Diagnosis not present

## 2015-01-07 ENCOUNTER — Ambulatory Visit: Payer: Medicare Other

## 2015-01-09 DIAGNOSIS — M5116 Intervertebral disc disorders with radiculopathy, lumbar region: Secondary | ICD-10-CM | POA: Diagnosis not present

## 2015-01-09 DIAGNOSIS — M9905 Segmental and somatic dysfunction of pelvic region: Secondary | ICD-10-CM | POA: Diagnosis not present

## 2015-01-09 DIAGNOSIS — M9903 Segmental and somatic dysfunction of lumbar region: Secondary | ICD-10-CM | POA: Diagnosis not present

## 2015-01-09 DIAGNOSIS — M9904 Segmental and somatic dysfunction of sacral region: Secondary | ICD-10-CM | POA: Diagnosis not present

## 2015-01-09 DIAGNOSIS — M5137 Other intervertebral disc degeneration, lumbosacral region: Secondary | ICD-10-CM | POA: Diagnosis not present

## 2015-01-09 DIAGNOSIS — Q72812 Congenital shortening of left lower limb: Secondary | ICD-10-CM | POA: Diagnosis not present

## 2015-01-14 ENCOUNTER — Ambulatory Visit (INDEPENDENT_AMBULATORY_CARE_PROVIDER_SITE_OTHER): Payer: Medicare Other

## 2015-01-14 DIAGNOSIS — J309 Allergic rhinitis, unspecified: Secondary | ICD-10-CM

## 2015-01-16 DIAGNOSIS — Q72812 Congenital shortening of left lower limb: Secondary | ICD-10-CM | POA: Diagnosis not present

## 2015-01-16 DIAGNOSIS — M9903 Segmental and somatic dysfunction of lumbar region: Secondary | ICD-10-CM | POA: Diagnosis not present

## 2015-01-16 DIAGNOSIS — M9905 Segmental and somatic dysfunction of pelvic region: Secondary | ICD-10-CM | POA: Diagnosis not present

## 2015-01-16 DIAGNOSIS — M5116 Intervertebral disc disorders with radiculopathy, lumbar region: Secondary | ICD-10-CM | POA: Diagnosis not present

## 2015-01-16 DIAGNOSIS — M5137 Other intervertebral disc degeneration, lumbosacral region: Secondary | ICD-10-CM | POA: Diagnosis not present

## 2015-01-16 DIAGNOSIS — M9904 Segmental and somatic dysfunction of sacral region: Secondary | ICD-10-CM | POA: Diagnosis not present

## 2015-01-21 ENCOUNTER — Ambulatory Visit: Payer: Medicare Other

## 2015-01-21 ENCOUNTER — Telehealth: Payer: Self-pay | Admitting: Internal Medicine

## 2015-01-21 MED ORDER — AZITHROMYCIN 250 MG PO TABS
ORAL_TABLET | ORAL | Status: DC
Start: 2015-01-21 — End: 2015-08-11

## 2015-01-21 NOTE — Telephone Encounter (Signed)
Called and spoke to pt. Pt c/o dry cough, body aches, nausea, sore throat, rhinorrhea, sinus congestion, headache, chills and sweats ( has not taken tempreature) X 3 days. Pt is taking tylenol, Mucinex D prn. Pt requesting recs. (pt's husband also has similar s/s)  CY please advise.   Allergies  Allergen Reactions  . Dextromethorphan Other (See Comments)    Severe hot feeling, sweating, flushed,incoherent  . Codeine   . Darvon   . Propoxyphene Hcl     Current Outpatient Prescriptions on File Prior to Visit  Medication Sig Dispense Refill  . azithromycin (ZITHROMAX Z-PAK) 250 MG tablet Take 2 today and then 1 daily until gone. 6 each 0  . benzonatate (TESSALON) 200 MG capsule Take 1 capsule (200 mg total) by mouth every 8 (eight) hours as needed for cough. 30 capsule 1  . Cholecalciferol (VITAMIN D3) 3000 UNITS TABS Take 4,000 Units by mouth daily.     Marland Kitchen doxycycline (VIBRA-TABS) 100 MG tablet Take 2 tablets today then 1 tablet daily until gone. 8 tablet 0  . Multiple Vitamins-Minerals (CENTRUM SILVER) tablet Take 1 tablet by mouth daily.    . phenylephrine (SUDAFED PE) 10 MG TABS Take 10 mg by mouth every 4 (four) hours as needed.      . simvastatin (ZOCOR) 20 MG tablet Take 20 mg by mouth at bedtime.      . vitamin C (ASCORBIC ACID) 500 MG tablet Take 500 mg by mouth daily.    . [DISCONTINUED] mometasone (NASONEX) 50 MCG/ACT nasal spray Place 2 sprays into the nose daily. At bedtime 17 g 2   No current facility-administered medications on file prior to visit.

## 2015-01-21 NOTE — Telephone Encounter (Signed)
Called and spoke to pt. Informed him of the recs per CY. Rx sent to preferred pharmacy. Pt verbalized understanding and denied any further questions or concerns at this time.   

## 2015-01-21 NOTE — Telephone Encounter (Signed)
Same advice as for husband. Offer Z pak. Stay hydrated.

## 2015-01-28 ENCOUNTER — Ambulatory Visit (INDEPENDENT_AMBULATORY_CARE_PROVIDER_SITE_OTHER): Payer: Medicare Other

## 2015-01-28 DIAGNOSIS — J309 Allergic rhinitis, unspecified: Secondary | ICD-10-CM | POA: Diagnosis not present

## 2015-01-30 DIAGNOSIS — M5116 Intervertebral disc disorders with radiculopathy, lumbar region: Secondary | ICD-10-CM | POA: Diagnosis not present

## 2015-01-30 DIAGNOSIS — Q72812 Congenital shortening of left lower limb: Secondary | ICD-10-CM | POA: Diagnosis not present

## 2015-01-30 DIAGNOSIS — M9905 Segmental and somatic dysfunction of pelvic region: Secondary | ICD-10-CM | POA: Diagnosis not present

## 2015-01-30 DIAGNOSIS — M9904 Segmental and somatic dysfunction of sacral region: Secondary | ICD-10-CM | POA: Diagnosis not present

## 2015-01-30 DIAGNOSIS — M5137 Other intervertebral disc degeneration, lumbosacral region: Secondary | ICD-10-CM | POA: Diagnosis not present

## 2015-01-30 DIAGNOSIS — M9903 Segmental and somatic dysfunction of lumbar region: Secondary | ICD-10-CM | POA: Diagnosis not present

## 2015-02-04 ENCOUNTER — Ambulatory Visit (INDEPENDENT_AMBULATORY_CARE_PROVIDER_SITE_OTHER): Payer: Medicare Other

## 2015-02-04 DIAGNOSIS — J309 Allergic rhinitis, unspecified: Secondary | ICD-10-CM

## 2015-02-11 ENCOUNTER — Ambulatory Visit (INDEPENDENT_AMBULATORY_CARE_PROVIDER_SITE_OTHER): Payer: Medicare Other

## 2015-02-11 DIAGNOSIS — J309 Allergic rhinitis, unspecified: Secondary | ICD-10-CM | POA: Diagnosis not present

## 2015-02-13 DIAGNOSIS — M5116 Intervertebral disc disorders with radiculopathy, lumbar region: Secondary | ICD-10-CM | POA: Diagnosis not present

## 2015-02-13 DIAGNOSIS — Q72812 Congenital shortening of left lower limb: Secondary | ICD-10-CM | POA: Diagnosis not present

## 2015-02-13 DIAGNOSIS — M9903 Segmental and somatic dysfunction of lumbar region: Secondary | ICD-10-CM | POA: Diagnosis not present

## 2015-02-13 DIAGNOSIS — M5137 Other intervertebral disc degeneration, lumbosacral region: Secondary | ICD-10-CM | POA: Diagnosis not present

## 2015-02-13 DIAGNOSIS — M9905 Segmental and somatic dysfunction of pelvic region: Secondary | ICD-10-CM | POA: Diagnosis not present

## 2015-02-13 DIAGNOSIS — M79672 Pain in left foot: Secondary | ICD-10-CM | POA: Diagnosis not present

## 2015-02-13 DIAGNOSIS — M9904 Segmental and somatic dysfunction of sacral region: Secondary | ICD-10-CM | POA: Diagnosis not present

## 2015-02-14 ENCOUNTER — Encounter: Payer: Self-pay | Admitting: Internal Medicine

## 2015-02-18 ENCOUNTER — Ambulatory Visit (INDEPENDENT_AMBULATORY_CARE_PROVIDER_SITE_OTHER): Payer: Medicare Other

## 2015-02-18 DIAGNOSIS — J309 Allergic rhinitis, unspecified: Secondary | ICD-10-CM | POA: Diagnosis not present

## 2015-02-28 ENCOUNTER — Ambulatory Visit (INDEPENDENT_AMBULATORY_CARE_PROVIDER_SITE_OTHER): Payer: Medicare Other

## 2015-02-28 ENCOUNTER — Telehealth: Payer: Self-pay | Admitting: Internal Medicine

## 2015-02-28 DIAGNOSIS — J309 Allergic rhinitis, unspecified: Secondary | ICD-10-CM | POA: Diagnosis not present

## 2015-02-28 NOTE — Telephone Encounter (Signed)
Date Mixed: 02/27/2015 Vial: AB Strength: 1:10 Here/Mail/Pick Up: Here Mixed By: Alroy Bailiff

## 2015-03-05 ENCOUNTER — Ambulatory Visit (INDEPENDENT_AMBULATORY_CARE_PROVIDER_SITE_OTHER): Payer: Medicare Other

## 2015-03-05 DIAGNOSIS — J309 Allergic rhinitis, unspecified: Secondary | ICD-10-CM

## 2015-03-11 ENCOUNTER — Ambulatory Visit (INDEPENDENT_AMBULATORY_CARE_PROVIDER_SITE_OTHER): Payer: Medicare Other

## 2015-03-11 DIAGNOSIS — J309 Allergic rhinitis, unspecified: Secondary | ICD-10-CM | POA: Diagnosis not present

## 2015-03-19 ENCOUNTER — Ambulatory Visit (INDEPENDENT_AMBULATORY_CARE_PROVIDER_SITE_OTHER): Payer: Medicare Other

## 2015-03-19 DIAGNOSIS — J309 Allergic rhinitis, unspecified: Secondary | ICD-10-CM

## 2015-03-25 ENCOUNTER — Ambulatory Visit (INDEPENDENT_AMBULATORY_CARE_PROVIDER_SITE_OTHER): Payer: Medicare Other

## 2015-03-25 DIAGNOSIS — J309 Allergic rhinitis, unspecified: Secondary | ICD-10-CM

## 2015-03-31 DIAGNOSIS — M7672 Peroneal tendinitis, left leg: Secondary | ICD-10-CM | POA: Diagnosis not present

## 2015-04-01 ENCOUNTER — Ambulatory Visit (INDEPENDENT_AMBULATORY_CARE_PROVIDER_SITE_OTHER): Payer: Medicare Other

## 2015-04-01 DIAGNOSIS — J309 Allergic rhinitis, unspecified: Secondary | ICD-10-CM | POA: Diagnosis not present

## 2015-04-08 ENCOUNTER — Ambulatory Visit (INDEPENDENT_AMBULATORY_CARE_PROVIDER_SITE_OTHER): Payer: Medicare Other

## 2015-04-08 DIAGNOSIS — J309 Allergic rhinitis, unspecified: Secondary | ICD-10-CM | POA: Diagnosis not present

## 2015-04-14 DIAGNOSIS — J309 Allergic rhinitis, unspecified: Secondary | ICD-10-CM | POA: Diagnosis not present

## 2015-04-14 DIAGNOSIS — E782 Mixed hyperlipidemia: Secondary | ICD-10-CM | POA: Diagnosis not present

## 2015-04-14 DIAGNOSIS — Z1211 Encounter for screening for malignant neoplasm of colon: Secondary | ICD-10-CM | POA: Diagnosis not present

## 2015-04-14 DIAGNOSIS — E559 Vitamin D deficiency, unspecified: Secondary | ICD-10-CM | POA: Diagnosis not present

## 2015-04-15 ENCOUNTER — Ambulatory Visit (INDEPENDENT_AMBULATORY_CARE_PROVIDER_SITE_OTHER): Payer: Medicare Other

## 2015-04-15 DIAGNOSIS — M7672 Peroneal tendinitis, left leg: Secondary | ICD-10-CM | POA: Diagnosis not present

## 2015-04-15 DIAGNOSIS — J309 Allergic rhinitis, unspecified: Secondary | ICD-10-CM | POA: Diagnosis not present

## 2015-04-22 ENCOUNTER — Ambulatory Visit (INDEPENDENT_AMBULATORY_CARE_PROVIDER_SITE_OTHER): Payer: Medicare Other

## 2015-04-22 DIAGNOSIS — J309 Allergic rhinitis, unspecified: Secondary | ICD-10-CM | POA: Diagnosis not present

## 2015-04-22 DIAGNOSIS — M7670 Peroneal tendinitis, unspecified leg: Secondary | ICD-10-CM | POA: Diagnosis not present

## 2015-04-23 DIAGNOSIS — E559 Vitamin D deficiency, unspecified: Secondary | ICD-10-CM | POA: Diagnosis not present

## 2015-04-23 DIAGNOSIS — M8580 Other specified disorders of bone density and structure, unspecified site: Secondary | ICD-10-CM | POA: Diagnosis not present

## 2015-04-23 DIAGNOSIS — Z0000 Encounter for general adult medical examination without abnormal findings: Secondary | ICD-10-CM | POA: Diagnosis not present

## 2015-04-23 DIAGNOSIS — Z23 Encounter for immunization: Secondary | ICD-10-CM | POA: Diagnosis not present

## 2015-04-23 DIAGNOSIS — Z1389 Encounter for screening for other disorder: Secondary | ICD-10-CM | POA: Diagnosis not present

## 2015-04-23 DIAGNOSIS — E782 Mixed hyperlipidemia: Secondary | ICD-10-CM | POA: Diagnosis not present

## 2015-04-29 ENCOUNTER — Ambulatory Visit (INDEPENDENT_AMBULATORY_CARE_PROVIDER_SITE_OTHER): Payer: Medicare Other

## 2015-04-29 DIAGNOSIS — J309 Allergic rhinitis, unspecified: Secondary | ICD-10-CM | POA: Diagnosis not present

## 2015-04-29 DIAGNOSIS — M7670 Peroneal tendinitis, unspecified leg: Secondary | ICD-10-CM | POA: Diagnosis not present

## 2015-05-06 ENCOUNTER — Ambulatory Visit (INDEPENDENT_AMBULATORY_CARE_PROVIDER_SITE_OTHER): Payer: Medicare Other

## 2015-05-06 DIAGNOSIS — J309 Allergic rhinitis, unspecified: Secondary | ICD-10-CM | POA: Diagnosis not present

## 2015-05-07 ENCOUNTER — Encounter: Payer: Self-pay | Admitting: Internal Medicine

## 2015-05-13 ENCOUNTER — Encounter: Payer: Self-pay | Admitting: Internal Medicine

## 2015-05-13 ENCOUNTER — Ambulatory Visit (INDEPENDENT_AMBULATORY_CARE_PROVIDER_SITE_OTHER): Payer: Medicare Other

## 2015-05-13 DIAGNOSIS — J309 Allergic rhinitis, unspecified: Secondary | ICD-10-CM | POA: Diagnosis not present

## 2015-05-15 ENCOUNTER — Telehealth: Payer: Self-pay | Admitting: Internal Medicine

## 2015-05-15 MED ORDER — AZITHROMYCIN 250 MG PO TABS: ORAL_TABLET | ORAL | Status: AC

## 2015-05-15 NOTE — Telephone Encounter (Signed)
Spoke with pt, c/o sinus congestion, headache, PND X1 week.  Also notes chills.   Pt has been taking Mucinex D, tylenol, and neti pot- does not seem to alleviate symptoms. Pt uses [facility] on [address].   Last ov: Next ov:  CY please advise.  Thanks!  Allergies  Allergen Reactions  . Dextromethorphan Other (See Comments)    Severe hot feeling, sweating, flushed,incoherent  . Codeine   . Darvon   . Propoxyphene Hcl    Current Outpatient Prescriptions on File Prior to Visit  Medication Sig Dispense Refill  . azithromycin (ZITHROMAX Z-PAK) 250 MG tablet Take as directed. 6 each 0  . benzonatate (TESSALON) 200 MG capsule Take 1 capsule (200 mg total) by mouth every 8 (eight) hours as needed for cough. 30 capsule 1  . Cholecalciferol (VITAMIN D3) 3000 UNITS TABS Take 4,000 Units by mouth daily.     Marland Kitchen doxycycline (VIBRA-TABS) 100 MG tablet Take 2 tablets today then 1 tablet daily until gone. 8 tablet 0  . Multiple Vitamins-Minerals (CENTRUM SILVER) tablet Take 1 tablet by mouth daily.    . NONFORMULARY OR COMPOUNDED ITEM Allergy Vaccine 1:10 Given at [facility]    . phenylephrine (SUDAFED PE) 10 MG TABS Take 10 mg by mouth every 4 (four) hours as needed.      . simvastatin (ZOCOR) 20 MG tablet Take 20 mg by mouth at bedtime.      . vitamin C (ASCORBIC ACID) 500 MG tablet Take 500 mg by mouth daily.    . [DISCONTINUED] mometasone (NASONEX) 50 MCG/ACT nasal spray Place 2 sprays into the nose daily. At bedtime 17 g 2   No current facility-administered medications on file prior to visit.

## 2015-05-15 NOTE — Telephone Encounter (Signed)
Offer Zpak 

## 2015-05-15 NOTE — Telephone Encounter (Signed)
Pt is aware of CY's recommendation. Rx has been sent in. Nothing further was needed. 

## 2015-05-20 ENCOUNTER — Telehealth: Payer: Self-pay | Admitting: Internal Medicine

## 2015-05-20 ENCOUNTER — Ambulatory Visit (INDEPENDENT_AMBULATORY_CARE_PROVIDER_SITE_OTHER): Payer: Medicare Other

## 2015-05-20 DIAGNOSIS — J309 Allergic rhinitis, unspecified: Secondary | ICD-10-CM | POA: Diagnosis not present

## 2015-05-20 NOTE — Telephone Encounter (Signed)
Allergy Serum Extract Date Mixed: 05/20/15 Vial: 2 Strength: 1:10 Here/Mail/Pick Up: here Mixed By: tbs

## 2015-05-27 ENCOUNTER — Ambulatory Visit (INDEPENDENT_AMBULATORY_CARE_PROVIDER_SITE_OTHER): Payer: Medicare Other

## 2015-05-27 DIAGNOSIS — Z23 Encounter for immunization: Secondary | ICD-10-CM | POA: Diagnosis not present

## 2015-05-27 DIAGNOSIS — J309 Allergic rhinitis, unspecified: Secondary | ICD-10-CM

## 2015-05-29 DIAGNOSIS — Z85828 Personal history of other malignant neoplasm of skin: Secondary | ICD-10-CM | POA: Diagnosis not present

## 2015-05-29 DIAGNOSIS — L232 Allergic contact dermatitis due to cosmetics: Secondary | ICD-10-CM | POA: Diagnosis not present

## 2015-05-29 DIAGNOSIS — D485 Neoplasm of uncertain behavior of skin: Secondary | ICD-10-CM | POA: Diagnosis not present

## 2015-05-29 DIAGNOSIS — L820 Inflamed seborrheic keratosis: Secondary | ICD-10-CM | POA: Diagnosis not present

## 2015-06-02 ENCOUNTER — Ambulatory Visit: Payer: Medicare Other | Admitting: Internal Medicine

## 2015-06-03 ENCOUNTER — Ambulatory Visit (INDEPENDENT_AMBULATORY_CARE_PROVIDER_SITE_OTHER): Payer: Medicare Other

## 2015-06-03 DIAGNOSIS — J309 Allergic rhinitis, unspecified: Secondary | ICD-10-CM | POA: Diagnosis not present

## 2015-06-10 ENCOUNTER — Ambulatory Visit (INDEPENDENT_AMBULATORY_CARE_PROVIDER_SITE_OTHER): Payer: Medicare Other

## 2015-06-10 DIAGNOSIS — J309 Allergic rhinitis, unspecified: Secondary | ICD-10-CM

## 2015-06-11 DIAGNOSIS — Z1211 Encounter for screening for malignant neoplasm of colon: Secondary | ICD-10-CM | POA: Diagnosis not present

## 2015-06-16 DIAGNOSIS — Z124 Encounter for screening for malignant neoplasm of cervix: Secondary | ICD-10-CM | POA: Diagnosis not present

## 2015-06-17 ENCOUNTER — Ambulatory Visit (INDEPENDENT_AMBULATORY_CARE_PROVIDER_SITE_OTHER): Payer: Medicare Other

## 2015-06-17 DIAGNOSIS — J309 Allergic rhinitis, unspecified: Secondary | ICD-10-CM | POA: Diagnosis not present

## 2015-06-24 ENCOUNTER — Ambulatory Visit (INDEPENDENT_AMBULATORY_CARE_PROVIDER_SITE_OTHER): Payer: Medicare Other

## 2015-06-24 DIAGNOSIS — J309 Allergic rhinitis, unspecified: Secondary | ICD-10-CM | POA: Diagnosis not present

## 2015-07-01 ENCOUNTER — Ambulatory Visit (INDEPENDENT_AMBULATORY_CARE_PROVIDER_SITE_OTHER): Payer: Medicare Other

## 2015-07-01 DIAGNOSIS — J309 Allergic rhinitis, unspecified: Secondary | ICD-10-CM

## 2015-07-08 ENCOUNTER — Ambulatory Visit (INDEPENDENT_AMBULATORY_CARE_PROVIDER_SITE_OTHER): Payer: Medicare Other

## 2015-07-08 DIAGNOSIS — J309 Allergic rhinitis, unspecified: Secondary | ICD-10-CM

## 2015-07-15 ENCOUNTER — Ambulatory Visit (INDEPENDENT_AMBULATORY_CARE_PROVIDER_SITE_OTHER): Payer: Medicare Other

## 2015-07-15 DIAGNOSIS — J309 Allergic rhinitis, unspecified: Secondary | ICD-10-CM | POA: Diagnosis not present

## 2015-07-15 DIAGNOSIS — M8580 Other specified disorders of bone density and structure, unspecified site: Secondary | ICD-10-CM | POA: Diagnosis not present

## 2015-07-22 ENCOUNTER — Ambulatory Visit (INDEPENDENT_AMBULATORY_CARE_PROVIDER_SITE_OTHER): Payer: Medicare Other

## 2015-07-22 DIAGNOSIS — J309 Allergic rhinitis, unspecified: Secondary | ICD-10-CM | POA: Diagnosis not present

## 2015-07-29 ENCOUNTER — Ambulatory Visit (INDEPENDENT_AMBULATORY_CARE_PROVIDER_SITE_OTHER): Payer: Medicare Other

## 2015-07-29 DIAGNOSIS — J309 Allergic rhinitis, unspecified: Secondary | ICD-10-CM | POA: Diagnosis not present

## 2015-08-04 ENCOUNTER — Ambulatory Visit: Payer: Medicare Other | Admitting: Internal Medicine

## 2015-08-05 ENCOUNTER — Ambulatory Visit (INDEPENDENT_AMBULATORY_CARE_PROVIDER_SITE_OTHER): Payer: Medicare Other

## 2015-08-05 DIAGNOSIS — J309 Allergic rhinitis, unspecified: Secondary | ICD-10-CM

## 2015-08-11 ENCOUNTER — Ambulatory Visit: Payer: Medicare Other | Admitting: Internal Medicine

## 2015-08-11 ENCOUNTER — Encounter: Payer: Self-pay | Admitting: Internal Medicine

## 2015-08-11 VITALS — BP 128/88 | HR 85 | Ht 60.0 in | Wt 157.6 lb

## 2015-08-11 DIAGNOSIS — J301 Allergic rhinitis due to pollen: Secondary | ICD-10-CM

## 2015-08-11 NOTE — Patient Instructions (Signed)
We can continue allergy vaccine 1:10 GH   We will check with her primary physician to see which pneumonia vaccine you got this summer for our record  Please call if we can help

## 2015-08-11 NOTE — Progress Notes (Signed)
Patient ID: [name] [name], female    DOB: Feb 16, 1951, 64 y.o.   MRN: [id number]  HPI 05/13/11- 64 year old female never smoker followed for allergic rhinitis, lung nodules on chest x-ray/2010 Last here -05/01/2010 Wants to wait on flu shot. Has some nasal congestion. Was treated for sinusitis in August with Sudafed and Mucinex. We gave Augmentin and 5 days of prednisone. She feels nothing to blow out of her nose now. Chest is clear. Denies headache chest pain fever sore throat or nodes. Continues allergy vaccine.  10/29/11- 64 year old female never smoker followed for allergic rhinitis, lung nodules on chest x-ray/2010 Acute visit-sinus congestion as well as chest congestion; cough-prod-not much color; slight sneezing but rare-able to blow nose and no color. For 2 weeks has had head congestion extending into chest. Initially sore throat, now gone. Mucus clear with some brown. Feels stopped up. Chest tight without wheeze or cough. Taking Mucinex D each morning, Sudafed each evening.    CT chest 05/28/11-  Images reviewed w/ her-    IMPRESSION:  1. Stable 5 mm right upper lobe nodule, consistent with benign  etiology.  2. No active disease or other significant abnormality identified.  Original Report Authenticated By: Marlaine Hind, M.D.   05/19/12- 64 year old female never smoker followed for allergic rhinitis, lung nodules on chest x-ray/2010  Pt states doing well, still taking allergy vaccine 1:10 GH.  Nasal congestion required medications through the wet weather but is much better now. Some pain at the opening of left nostril. Steroid nasal sprays all over -dried her. Uses saline gel.  Will get allergy vaccine elsewhere. Lung nodule appears benign reviewed from CT scan in October, 2012.  08/17/12- 64 year old female never smoker followed for allergic rhinitis, lung nodules on chest x-ray/2010 chest congestion, cough with a Franze amount of clear to yellow mucus, fatigue, wheezing, and f/c/s.   Symptoms started x 1 wk ago. Acute onset cough, fever, chills, yellow sputum, nasal congestion. No stomach upset. Little sore throat. Has had flu vaccine. Taking Mucinex D. CT chest 05/28/11- IMPRESSION:  1. Stable 5 mm right upper lobe nodule, consistent with benign  etiology.  2. No active disease or other significant abnormality identified.  Original Report Authenticated By: Marlaine Hind, M.D.   06/01/13- 64 year old female never smoker followed for allergic rhinitis, lung nodules on chest x-ray/2010 FOLLOWS FOR: still on Allergy vaccine 1:10 [facility] and doing well. Wants to wait on flu shot-discussed. Rare need for antihistamine. Uses saline nasal spray or gel.  06/03/14-64 year old female never smoker followed for allergic rhinitis, lung nodules on chest x-ray/2010 FOLLOWS FOR: still on Allergy vaccine 1:10 GH- having stuffiness today; throbing headaches, eyes feel warm and feels weak.  08/11/2015-64 year old female never smoker followed for allergic rhinitis, lung nodules on CXR/2010 Allergy vaccine 1:10 [facility] Follows for: AR. Pt states that she is doing well. Pt is on allergy injections and has occasional mild reactions. No other complaints or concerns.     Review of Systems-see HPI Constitutional:   No-   weight loss, night sweats,no- fevers, no-chills, fatigue, lassitude. HEENT:   No-  headaches, difficulty swallowing, tooth/dental problems, sore throat,       No-  sneezing, itching, ear ache, +nasal congestion, post nasal drip,  CV:  No-   chest pain, orthopnea, PND, swelling in lower extremities, anasarca, dizziness, palpitations Resp: No-   shortness of breath with exertion or at rest.             No-  productive cough,  No non-productive cough,  No-  coughing up of blood.             No- change in color of mucus.  No- wheezing.   Skin: No-   rash or lesions. GI:  No-   heartburn, indigestion, abdominal pain, nausea, vomiting,  GU:  MS:  No-   joint pain or swelling.  . Neuro-  grossly normal  Psych:  No- change in mood or affect. No depression or anxiety.  No memory loss.  Objective:   Physical Exam General- Alert, Oriented, Affect-appropriate, Distress- none acute Skin- rash-none, lesions- none, excoriation- none. Fair complected, question rosacea. Lymphadenopathy- none Head- atraumatic            Eyes- Gross vision intact, PERRLA, conjunctivae clear secretions            Ears- Hearing, canals-normal            Nose-  no-Septal dev, mucus+ minor crusting, no-polyps, erosion, perforation             Throat- Mallampati III , +mucosa red without exudate , drainage- none, tonsils- atrophic Neck- flexible , trachea midline, no stridor , thyroid nl, carotid no bruit Chest - symmetrical excursion , unlabored           Heart/CV- RRR , no murmur , no gallop  , no rub, nl s1 s2                           - JVD- none , edema- none, stasis changes- none, varices- none           Lung- clear to P&A, wheeze- none,  coughnon- , dullness-none, rub- none           Chest wall-  Abd-  Br/ Gen/ Rectal- Not done, not indicated Extrem- cyanosis- none, clubbing, none, atrophy- none, strength- nl Neuro- grossly intact to observation

## 2015-08-12 ENCOUNTER — Ambulatory Visit (INDEPENDENT_AMBULATORY_CARE_PROVIDER_SITE_OTHER): Payer: Medicare Other

## 2015-08-12 DIAGNOSIS — J309 Allergic rhinitis, unspecified: Secondary | ICD-10-CM | POA: Diagnosis not present

## 2015-08-15 ENCOUNTER — Telehealth: Payer: Self-pay | Admitting: Internal Medicine

## 2015-08-15 MED ORDER — AZITHROMYCIN 250 MG PO TABS: ORAL_TABLET | ORAL | Status: DC

## 2015-08-15 NOTE — Telephone Encounter (Signed)
Called spoke with pt. C/o HA, bilateral ear, nasal cong, facial pressure across eyes, nose, and cheeks. Denies any fever, no cough. Please advise Dr. Annamaria Boots thanks  Allergies  Allergen Reactions  . Dextromethorphan Other (See Comments)    Severe hot feeling, sweating, flushed,incoherent  . Codeine   . Darvon   . Propoxyphene Hcl      Current Outpatient Prescriptions on File Prior to Visit  Medication Sig Dispense Refill  . benzonatate (TESSALON) 200 MG capsule Take 1 capsule (200 mg total) by mouth every 8 (eight) hours as needed for cough. (Patient not taking: Reported on 08/11/2015) 30 capsule 1  . Cholecalciferol (VITAMIN D3) 3000 UNITS TABS Take 4,000 Units by mouth daily.     . Multiple Vitamins-Minerals (CENTRUM SILVER) tablet Take 1 tablet by mouth daily.    . NONFORMULARY OR COMPOUNDED ITEM Allergy Vaccine 1:10 Given at [facility]    . phenylephrine (SUDAFED PE) 10 MG TABS Take 10 mg by mouth every 4 (four) hours as needed. Reported on 08/11/2015    . simvastatin (ZOCOR) 20 MG tablet Take 20 mg by mouth at bedtime.      . vitamin C (ASCORBIC ACID) 500 MG tablet Take 500 mg by mouth daily.    . [DISCONTINUED] mometasone (NASONEX) 50 MCG/ACT nasal spray Place 2 sprays into the nose daily. At bedtime 17 g 2   No current facility-administered medications on file prior to visit.

## 2015-08-15 NOTE — Telephone Encounter (Signed)
Offer Zpak.   Suggest a decongestant like Sudafed from pharmacy counter Mucinex, extra fluid hydration and maybe a saline nasal rinse

## 2015-08-15 NOTE — Telephone Encounter (Signed)
Patient's husband notified of CY's recommendations. Rx sent to pharmacy. Nothing further needed.

## 2015-08-19 ENCOUNTER — Ambulatory Visit: Payer: Medicare Other

## 2015-08-19 ENCOUNTER — Telehealth: Payer: Self-pay | Admitting: Internal Medicine

## 2015-08-19 NOTE — Telephone Encounter (Addendum)
Called and spoke with patient. She c/o chest congestion, sinus pressure/drainage, prod cough with pale yellow mucus, and wheezing. She completed the zpak given on 08/15/15. Denies any fever, nausea or vomiting. She is currently taking mucinex, Claritin D, and using her netie pot. Pt request ov with CY. I explained to the patient that CY has no availability and that he has left for the day but would return in the morning. I explained to her that I will send message to VS in CY's absence. Patient stated that she would rather wait until CY is back in the office to receive CY's recs. Patient voiced understanding. Will send message to CY to address in the morning.  CY please advise  Allergies  Allergen Reactions  . Dextromethorphan Other (See Comments)    Severe hot feeling, sweating, flushed,incoherent  . Codeine   . Darvon   . Propoxyphene Hcl     Current outpatient prescriptions:  .  azithromycin (ZITHROMAX) 250 MG tablet, Take as directed., Disp: 6 tablet, Rfl: 0 .  benzonatate (TESSALON) 200 MG capsule, Take 1 capsule (200 mg total) by mouth every 8 (eight) hours as needed for cough. (Patient not taking: Reported on 08/11/2015), Disp: 30 capsule, Rfl: 1 .  Cholecalciferol (VITAMIN D3) 3000 UNITS TABS, Take 4,000 Units by mouth daily. , Disp: , Rfl:  .  Multiple Vitamins-Minerals (CENTRUM SILVER) tablet, Take 1 tablet by mouth daily., Disp: , Rfl:  .  NONFORMULARY OR COMPOUNDED ITEM, Allergy Vaccine 1:10 Given at [facility], Disp: , Rfl:  .  phenylephrine (SUDAFED PE) 10 MG TABS, Take 10 mg by mouth every 4 (four) hours as needed. Reported on 08/11/2015, Disp: , Rfl:  .  simvastatin (ZOCOR) 20 MG tablet, Take 20 mg by mouth at bedtime.  , Disp: , Rfl:  .  vitamin C (ASCORBIC ACID) 500 MG tablet, Take 500 mg by mouth daily., Disp: , Rfl:  .  [DISCONTINUED] mometasone (NASONEX) 50 MCG/ACT nasal spray, Place 2 sprays into the nose daily. At bedtime, Disp: 17 g, Rfl: 2

## 2015-08-20 NOTE — Telephone Encounter (Signed)
She has not been doing well. [name] [name], please see if we can squeeze her in with me this week.

## 2015-08-20 NOTE — Telephone Encounter (Signed)
Pt can be seen Thursday or Friday in either of the held spots. Thanks.

## 2015-08-20 NOTE — Telephone Encounter (Signed)
Called spoke with pt. She is scheduled to come in tomorrow to see Dr. Annamaria Boots

## 2015-08-21 ENCOUNTER — Ambulatory Visit (INDEPENDENT_AMBULATORY_CARE_PROVIDER_SITE_OTHER): Payer: Medicare Other | Admitting: Internal Medicine

## 2015-08-21 ENCOUNTER — Encounter: Payer: Self-pay | Admitting: Internal Medicine

## 2015-08-21 VITALS — BP 122/68 | HR 103 | Ht 60.0 in | Wt 156.4 lb

## 2015-08-21 DIAGNOSIS — J069 Acute upper respiratory infection, unspecified: Secondary | ICD-10-CM

## 2015-08-21 DIAGNOSIS — J45909 Unspecified asthma, uncomplicated: Secondary | ICD-10-CM

## 2015-08-21 DIAGNOSIS — J301 Allergic rhinitis due to pollen: Secondary | ICD-10-CM

## 2015-08-21 MED ORDER — LEVALBUTEROL HCL 0.63 MG/3ML IN NEBU
0.6300 mg | INHALATION_SOLUTION | Freq: Once | RESPIRATORY_TRACT | Status: AC
  Administered 2015-08-21: 0.63 mg via RESPIRATORY_TRACT

## 2015-08-21 MED ORDER — METHYLPREDNISOLONE ACETATE 80 MG/ML IJ SUSP
80.0000 mg | Freq: Once | INTRAMUSCULAR | Status: AC
  Administered 2015-08-21: 80 mg via INTRAMUSCULAR

## 2015-08-21 NOTE — Patient Instructions (Addendum)
Neb xop 0.63  Depo 76  Ok to take comfort meds - otc cold and flu remedies, Sudafed, nasal saline sinus rinse, if they help  We discussed referral to ENT if needed

## 2015-08-21 NOTE — Progress Notes (Signed)
Patient ID: [name] [name], female    DOB: [birth year], 64 y.o.   MRN: [id number]  HPI 05/13/11- 64 year old female never smoker followed for allergic rhinitis, lung nodules on chest x-ray/2010 Last here -05/01/2010 Wants to wait on flu shot. Has some nasal congestion. Was treated for sinusitis in August with Sudafed and Mucinex. We gave Augmentin and 5 days of prednisone. She feels nothing to blow out of her nose now. Chest is clear. Denies headache chest pain fever sore throat or nodes. Continues allergy vaccine.  10/29/11- 64 year old female never smoker followed for allergic rhinitis, lung nodules on chest x-ray/2010 Acute visit-sinus congestion as well as chest congestion; cough-prod-not much color; slight sneezing but rare-able to blow nose and no color. For 2 weeks has had head congestion extending into chest. Initially sore throat, now gone. Mucus clear with some brown. Feels stopped up. Chest tight without wheeze or cough. Taking Mucinex D each morning, Sudafed each evening.    CT chest 05/28/11-  Images reviewed w/ her-    IMPRESSION:  1. Stable 5 mm right upper lobe nodule, consistent with benign  etiology.  2. No active disease or other significant abnormality identified.  Original Report Authenticated By: Marlaine Hind, M.D.   05/19/12- 64 year old female never smoker followed for allergic rhinitis, lung nodules on chest x-ray/2010  Pt states doing well, still taking allergy vaccine 1:10 GH.  Nasal congestion required medications through the wet weather but is much better now. Some pain at the opening of left nostril. Steroid nasal sprays all over -dried her. Uses saline gel.  Will get allergy vaccine elsewhere. Lung nodule appears benign reviewed from CT scan in October, 2012.  08/17/12- 64 year old female never smoker followed for allergic rhinitis, lung nodules on chest x-ray/2010 chest congestion, cough with a Shoults amount of clear to yellow mucus, fatigue, wheezing, and f/c/s.   Symptoms started x 1 wk ago. Acute onset cough, fever, chills, yellow sputum, nasal congestion. No stomach upset. Little sore throat. Has had flu vaccine. Taking Mucinex D. CT chest 05/28/11- IMPRESSION:  1. Stable 5 mm right upper lobe nodule, consistent with benign  etiology.  2. No active disease or other significant abnormality identified.  Original Report Authenticated By: Marlaine Hind, M.D.   06/01/13- 64 year old female never smoker followed for allergic rhinitis, lung nodules on chest x-ray/2010 FOLLOWS FOR: still on Allergy vaccine 1:10 GH and doing well. Wants to wait on flu shot-discussed. Rare need for antihistamine. Uses saline nasal spray or gel.  06/03/14-64 year old female never smoker followed for allergic rhinitis, lung nodules on chest x-ray/2010 FOLLOWS FOR: still on Allergy vaccine 1:10 GH- having stuffiness today; throbing headaches, eyes feel warm and feels weak.  08/11/2015-64 year old female never smoker followed for allergic rhinitis, lung nodules on CXR/2010 Allergy vaccine 1:10 GH Follows for: AR. Pt states that she is doing well. Pt is on allergy injections and has occasional mild reactions. No other complaints or concerns.   08/21/2015-64 year old female never smoker followed for allergic rhinitis, lung nodules on CXR 2010 Allergy vaccine 1:10 [place] Pt finished ZPAK from 08/15/15 and thought she was well . Now pt c/o nasal cong, PND, chest cong, some prod cough (pale yellow-milky color phlem), HA, sinus pressure. pt felt feverish a couple of days and has had chills.  URI and bronchitis symptoms, mostly chest with cough but no fever, wheeze with little sputum. Perles help.  Review of Systems-see HPI Constitutional:   No-   weight loss, night sweats,no- fevers, no-chills, fatigue, lassitude. HEENT:  No-  headaches, difficulty swallowing, tooth/dental problems, sore throat,       No-  sneezing, itching, ear ache, +nasal congestion, post nasal drip,  CV:  No-    chest pain, orthopnea, PND, swelling in lower extremities, anasarca, dizziness, palpitations Resp: No-   shortness of breath with exertion or at rest.             No-  productive cough,  + non-productive cough,  No-  coughing up of blood.             No- change in color of mucus.  + wheezing.   Skin: No-   rash or lesions. GI:  No-   heartburn, indigestion, abdominal pain, nausea, vomiting,  GU:  MS:  No-   joint pain or swelling.  . Neuro- grossly normal  Psych:  No- change in mood or affect. No depression or anxiety.  No memory loss.  Objective:   Physical Exam General- Alert, Oriented, Affect-appropriate, Distress- none acute Skin- rash-none, lesions- none, excoriation- none. Fair complected, question rosacea. Lymphadenopathy- none Head- atraumatic            Eyes- Gross vision intact, PERRLA, conjunctivae clear secretions            Ears- Hearing, canals-normal            Nose-  no-Septal dev, mucus+ minor crusting, no-polyps, erosion, perforation             Throat- Mallampati III , +mucosa red without exudate , drainage- none, tonsils- atrophic Neck- flexible , trachea midline, no stridor , thyroid nl, carotid no bruit Chest - symmetrical excursion , unlabored           Heart/CV- RRR , no murmur , no gallop  , no rub, nl s1 s2                           - JVD- none , edema- none, stasis changes- none, varices- none           Lung- clear to P&A, wheezey cough+,  coughnon- , dullness-none, rub- none           Chest wall-  Abd-  Br/ Gen/ Rectal- Not done, not indicated Extrem- cyanosis- none, clubbing, none, atrophy- none, strength- nl Neuro- grossly intact to observation

## 2015-08-24 NOTE — Assessment & Plan Note (Signed)
Acute URI and asthmatic bronchitis Plan Depo-Medrol, nebulizer treatment Xopenex, nasal saline rinse, Sudafed. Consider ENT if she feels she is getting another sinus infection

## 2015-08-26 ENCOUNTER — Ambulatory Visit (INDEPENDENT_AMBULATORY_CARE_PROVIDER_SITE_OTHER): Payer: Medicare Other

## 2015-08-26 DIAGNOSIS — J309 Allergic rhinitis, unspecified: Secondary | ICD-10-CM

## 2015-09-02 ENCOUNTER — Ambulatory Visit (INDEPENDENT_AMBULATORY_CARE_PROVIDER_SITE_OTHER): Payer: Medicare Other

## 2015-09-02 DIAGNOSIS — J309 Allergic rhinitis, unspecified: Secondary | ICD-10-CM

## 2015-09-04 ENCOUNTER — Telehealth: Payer: Self-pay | Admitting: Internal Medicine

## 2015-09-04 NOTE — Telephone Encounter (Signed)
Allergy Serum Extract Date Mixed: 09/04/15  Vial: 2 Strength: 1:10 Here/Mail/Pick Up: here Mixed By: tbs Last OV: 08/11/15 Pending OV: 08/24/16

## 2015-09-05 ENCOUNTER — Ambulatory Visit (INDEPENDENT_AMBULATORY_CARE_PROVIDER_SITE_OTHER): Payer: Medicare Other

## 2015-09-05 DIAGNOSIS — J309 Allergic rhinitis, unspecified: Secondary | ICD-10-CM

## 2015-09-09 ENCOUNTER — Ambulatory Visit (INDEPENDENT_AMBULATORY_CARE_PROVIDER_SITE_OTHER): Payer: Medicare Other

## 2015-09-09 DIAGNOSIS — J309 Allergic rhinitis, unspecified: Secondary | ICD-10-CM | POA: Diagnosis not present

## 2015-09-16 ENCOUNTER — Ambulatory Visit (INDEPENDENT_AMBULATORY_CARE_PROVIDER_SITE_OTHER): Payer: Medicare Other

## 2015-09-16 DIAGNOSIS — J309 Allergic rhinitis, unspecified: Secondary | ICD-10-CM

## 2015-09-23 ENCOUNTER — Ambulatory Visit (INDEPENDENT_AMBULATORY_CARE_PROVIDER_SITE_OTHER): Payer: Medicare Other

## 2015-09-23 DIAGNOSIS — J309 Allergic rhinitis, unspecified: Secondary | ICD-10-CM

## 2015-09-30 ENCOUNTER — Ambulatory Visit (INDEPENDENT_AMBULATORY_CARE_PROVIDER_SITE_OTHER): Payer: Medicare Other

## 2015-09-30 DIAGNOSIS — J309 Allergic rhinitis, unspecified: Secondary | ICD-10-CM

## 2015-10-07 ENCOUNTER — Ambulatory Visit (INDEPENDENT_AMBULATORY_CARE_PROVIDER_SITE_OTHER): Payer: Medicare Other

## 2015-10-07 DIAGNOSIS — J309 Allergic rhinitis, unspecified: Secondary | ICD-10-CM

## 2015-10-14 ENCOUNTER — Ambulatory Visit (INDEPENDENT_AMBULATORY_CARE_PROVIDER_SITE_OTHER): Payer: Medicare Other

## 2015-10-14 DIAGNOSIS — J309 Allergic rhinitis, unspecified: Secondary | ICD-10-CM

## 2015-10-16 ENCOUNTER — Telehealth: Payer: Self-pay | Admitting: Internal Medicine

## 2015-10-16 NOTE — Telephone Encounter (Signed)
Pt called in to allergy lab- c/o pink raised welt Xapprox 2 inches long at injection site.  Alroy Bailiff spoke with pt.  Pt denies fever, dizziness, pain at injection site.    CY please advise on recs.  Thanks!

## 2015-10-16 NOTE — Telephone Encounter (Signed)
An antihistamine and possibly some ice should help this time. Tammy, please advise her, and step back next vaccine dose as usual.

## 2015-10-20 NOTE — Telephone Encounter (Signed)
This is strange b/c she's been getting 0.2 of 1:10 for the last several wks. I called the pt. And gave her CDY's recs. And I took her back to 0.1 LA on her allergy record for her next shot. I was busy trying to tie up loose ends Thurs. p.m.,  that I forgot to tie up this one. Nothing further needed.

## 2015-10-20 NOTE — Telephone Encounter (Signed)
Called to speak with patient, was not aware that Alroy Bailiff had already contacted patient.   Tammy Scott documented her call while I was talking to patient on the phone. Closing encounter.

## 2015-10-21 ENCOUNTER — Ambulatory Visit (INDEPENDENT_AMBULATORY_CARE_PROVIDER_SITE_OTHER): Payer: Medicare Other | Admitting: *Deleted

## 2015-10-21 DIAGNOSIS — J309 Allergic rhinitis, unspecified: Secondary | ICD-10-CM | POA: Diagnosis not present

## 2015-10-28 ENCOUNTER — Ambulatory Visit (INDEPENDENT_AMBULATORY_CARE_PROVIDER_SITE_OTHER): Payer: Medicare Other | Admitting: *Deleted

## 2015-10-28 DIAGNOSIS — J309 Allergic rhinitis, unspecified: Secondary | ICD-10-CM | POA: Diagnosis not present

## 2015-11-04 ENCOUNTER — Ambulatory Visit: Payer: Medicare Other

## 2015-11-11 ENCOUNTER — Ambulatory Visit: Payer: Medicare Other

## 2015-11-17 DIAGNOSIS — H43812 Vitreous degeneration, left eye: Secondary | ICD-10-CM | POA: Diagnosis not present

## 2015-11-18 ENCOUNTER — Ambulatory Visit (INDEPENDENT_AMBULATORY_CARE_PROVIDER_SITE_OTHER): Payer: Medicare Other | Admitting: *Deleted

## 2015-11-18 DIAGNOSIS — J309 Allergic rhinitis, unspecified: Secondary | ICD-10-CM | POA: Diagnosis not present

## 2015-11-25 ENCOUNTER — Ambulatory Visit (INDEPENDENT_AMBULATORY_CARE_PROVIDER_SITE_OTHER): Payer: Medicare Other | Admitting: *Deleted

## 2015-11-25 DIAGNOSIS — J309 Allergic rhinitis, unspecified: Secondary | ICD-10-CM

## 2015-12-02 ENCOUNTER — Ambulatory Visit (INDEPENDENT_AMBULATORY_CARE_PROVIDER_SITE_OTHER): Payer: Medicare Other | Admitting: *Deleted

## 2015-12-02 DIAGNOSIS — J309 Allergic rhinitis, unspecified: Secondary | ICD-10-CM

## 2015-12-04 DIAGNOSIS — Z1231 Encounter for screening mammogram for malignant neoplasm of breast: Secondary | ICD-10-CM | POA: Diagnosis not present

## 2015-12-09 ENCOUNTER — Ambulatory Visit (INDEPENDENT_AMBULATORY_CARE_PROVIDER_SITE_OTHER): Payer: Medicare Other | Admitting: *Deleted

## 2015-12-09 DIAGNOSIS — J309 Allergic rhinitis, unspecified: Secondary | ICD-10-CM | POA: Diagnosis not present

## 2015-12-15 DIAGNOSIS — H43812 Vitreous degeneration, left eye: Secondary | ICD-10-CM | POA: Diagnosis not present

## 2015-12-16 ENCOUNTER — Ambulatory Visit (INDEPENDENT_AMBULATORY_CARE_PROVIDER_SITE_OTHER): Payer: Medicare Other | Admitting: *Deleted

## 2015-12-16 DIAGNOSIS — J309 Allergic rhinitis, unspecified: Secondary | ICD-10-CM

## 2015-12-23 ENCOUNTER — Encounter: Payer: Self-pay | Admitting: Internal Medicine

## 2015-12-23 ENCOUNTER — Ambulatory Visit (INDEPENDENT_AMBULATORY_CARE_PROVIDER_SITE_OTHER): Payer: Medicare Other | Admitting: Internal Medicine

## 2015-12-23 ENCOUNTER — Ambulatory Visit (INDEPENDENT_AMBULATORY_CARE_PROVIDER_SITE_OTHER)
Admission: RE | Admit: 2015-12-23 | Discharge: 2015-12-23 | Disposition: A | Discharge status: Home / Self Care | Payer: Medicare Other | Source: Ambulatory Visit | Attending: Internal Medicine | Admitting: Internal Medicine

## 2015-12-23 ENCOUNTER — Telehealth: Payer: Self-pay | Admitting: Internal Medicine

## 2015-12-23 ENCOUNTER — Ambulatory Visit (INDEPENDENT_AMBULATORY_CARE_PROVIDER_SITE_OTHER): Payer: Medicare Other | Admitting: *Deleted

## 2015-12-23 VITALS — BP 110/60 | HR 86 | Ht 60.0 in | Wt 158.0 lb

## 2015-12-23 DIAGNOSIS — R918 Other nonspecific abnormal finding of lung field: Secondary | ICD-10-CM | POA: Diagnosis not present

## 2015-12-23 DIAGNOSIS — R911 Solitary pulmonary nodule: Secondary | ICD-10-CM

## 2015-12-23 DIAGNOSIS — J309 Allergic rhinitis, unspecified: Secondary | ICD-10-CM

## 2015-12-23 DIAGNOSIS — J301 Allergic rhinitis due to pollen: Secondary | ICD-10-CM

## 2015-12-23 NOTE — Assessment & Plan Note (Signed)
Nodules met benign criteria as recently as 2012. After several years hiatus, I suggested we update chest x-ray

## 2015-12-23 NOTE — Telephone Encounter (Signed)
Allergy Serum Extract Date Mixed: 12/23/2015 Vial: B only Strength: 1:10 Here/Mail/Pick Up: Here Mixed By: Desmond Dike, CMA Last OV: 12/23/2015

## 2015-12-23 NOTE — Patient Instructions (Addendum)
We can continue allergy vaccine through this year as discussed  Order CXR dx lung nodules  Please call if we can help

## 2015-12-23 NOTE — Assessment & Plan Note (Signed)
Even in spring pollen season, she feels pretty well controlled. Tends to be stuffy rather than drainage or sneezing. We discussed Sudafed versus Sudafed PE, use of saline rinse

## 2015-12-23 NOTE — Progress Notes (Signed)
Patient ID: [name] [name], female    DOB: 1951/07/22, 65 y.o.   MRN: [id number]  HPI 05/13/11- 65 year old female never smoker followed for allergic rhinitis, lung nodules on chest x-ray/2010 Last here -05/01/2010 Wants to wait on flu shot. Has some nasal congestion. Was treated for sinusitis in August with Sudafed and Mucinex. We gave Augmentin and 5 days of prednisone. She feels nothing to blow out of her nose now. Chest is clear. Denies headache chest pain fever sore throat or nodes. Continues allergy vaccine.  10/29/11- 65 year old female never smoker followed for allergic rhinitis, lung nodules on chest x-ray/2010 Acute visit-sinus congestion as well as chest congestion; cough-prod-not much color; slight sneezing but rare-able to blow nose and no color. For 2 weeks has had head congestion extending into chest. Initially sore throat, now gone. Mucus clear with some brown. Feels stopped up. Chest tight without wheeze or cough. Taking Mucinex D each morning, Sudafed each evening.    CT chest 05/28/11-  Images reviewed w/ her-    IMPRESSION:  1. Stable 5 mm right upper lobe nodule, consistent with benign  etiology.  2. No active disease or other significant abnormality identified.  Original Report Authenticated By: Marlaine Hind, M.D.    08/21/2015-65 year old female never smoker followed for allergic rhinitis, lung nodules on CXR 2010 Allergy vaccine 1:10 [place] Pt finished ZPAK from 08/15/15 and thought she was well . Now pt c/o nasal cong, PND, chest cong, some prod cough (pale yellow-milky color phlem), HA, sinus pressure. pt felt feverish a couple of days and has had chills.  URI and bronchitis symptoms, mostly chest with cough but no fever, wheeze with little sputum. Perles help.  12/23/2015-65 year old female never smoker followed for allergic rhinitis, history stable lung nodules 2010 Allergy Vaccine 1:10 [facility] FOLLOWS FOR: Still on allergy vaccine and doing well. No reactions Some mild  stuffiness and headache occasionally, controlled. No wheezing, no postnasal drainage or sneezing.  Review of Systems-see HPI Constitutional:   No-   weight loss, night sweats,no- fevers, no-chills, fatigue, lassitude. HEENT:   +  headaches, difficulty swallowing, tooth/dental problems, sore throat,       No-  sneezing, itching, ear ache, +nasal congestion, post nasal drip,  CV:  No-   chest pain, orthopnea, PND, swelling in lower extremities, anasarca, dizziness, palpitations Resp: No-   shortness of breath with exertion or at rest.             No-  productive cough,  + non-productive cough,  No-  coughing up of blood.             No- change in color of mucus.  + wheezing.   Skin: No-   rash or lesions. GI:  No-   heartburn, indigestion, abdominal pain, nausea, vomiting,  GU:  MS:  No-   joint pain or swelling.  . Neuro- grossly normal  Psych:  No- change in mood or affect. No depression or anxiety.  No memory loss.  Objective:   Physical Exam General- Alert, Oriented, Affect-appropriate, Distress- none acute Skin- rash-none, lesions- none, excoriation- none. Fair complected, question rosacea. Lymphadenopathy- none Head- atraumatic            Eyes- Gross vision intact, PERRLA, conjunctivae clear secretions            Ears- Hearing, canals-normal            Nose-  no-Septal dev, mucus, no-polyps, erosion, perforation , + turbinate edema  Throat- Mallampati III , +mucosa red without exudate , drainage- none, tonsils- atrophic Neck- flexible , trachea midline, no stridor , thyroid nl, carotid no bruit Chest - symmetrical excursion , unlabored           Heart/CV- RRR , no murmur , no gallop  , no rub, nl s1 s2                           - JVD- none , edema- none, stasis changes- none, varices- none           Lung- clear to P&A, wheezey cough+,  coughnon- , dullness-none, rub- none           Chest wall-  Abd-  Br/ Gen/ Rectal- Not done, not indicated Extrem- cyanosis- none,  clubbing, none, atrophy- none, strength- nl Neuro- grossly intact to observation

## 2015-12-30 ENCOUNTER — Ambulatory Visit (INDEPENDENT_AMBULATORY_CARE_PROVIDER_SITE_OTHER): Payer: Medicare Other

## 2015-12-30 DIAGNOSIS — J309 Allergic rhinitis, unspecified: Secondary | ICD-10-CM

## 2016-01-06 ENCOUNTER — Ambulatory Visit (INDEPENDENT_AMBULATORY_CARE_PROVIDER_SITE_OTHER): Payer: Medicare Other | Admitting: *Deleted

## 2016-01-06 DIAGNOSIS — J309 Allergic rhinitis, unspecified: Secondary | ICD-10-CM | POA: Diagnosis not present

## 2016-01-08 ENCOUNTER — Encounter: Payer: Self-pay | Admitting: Internal Medicine

## 2016-01-13 ENCOUNTER — Ambulatory Visit (INDEPENDENT_AMBULATORY_CARE_PROVIDER_SITE_OTHER): Payer: Medicare Other

## 2016-01-13 DIAGNOSIS — J309 Allergic rhinitis, unspecified: Secondary | ICD-10-CM | POA: Diagnosis not present

## 2016-01-20 ENCOUNTER — Ambulatory Visit (INDEPENDENT_AMBULATORY_CARE_PROVIDER_SITE_OTHER): Payer: Medicare Other | Admitting: *Deleted

## 2016-01-20 DIAGNOSIS — J309 Allergic rhinitis, unspecified: Secondary | ICD-10-CM

## 2016-01-27 ENCOUNTER — Ambulatory Visit (INDEPENDENT_AMBULATORY_CARE_PROVIDER_SITE_OTHER): Payer: Medicare Other | Admitting: *Deleted

## 2016-01-27 DIAGNOSIS — J309 Allergic rhinitis, unspecified: Secondary | ICD-10-CM | POA: Diagnosis not present

## 2016-02-03 ENCOUNTER — Ambulatory Visit (INDEPENDENT_AMBULATORY_CARE_PROVIDER_SITE_OTHER): Payer: Medicare Other | Admitting: *Deleted

## 2016-02-03 DIAGNOSIS — J309 Allergic rhinitis, unspecified: Secondary | ICD-10-CM

## 2016-02-05 ENCOUNTER — Telehealth: Payer: Self-pay | Admitting: Internal Medicine

## 2016-02-05 NOTE — Telephone Encounter (Signed)
No need to charge for this one

## 2016-02-05 NOTE — Telephone Encounter (Signed)
[name] [name] has had some reactions, so we've dropped back, built up etc.. We last made up vial B 12/23/15. I just made up her vial  A today. Do we charge her for it? Not sure if her insurance will cover this one since it's only been a little over a month. Please advise.

## 2016-02-05 NOTE — Telephone Encounter (Signed)
Noted.  Nothing further needed.  Closing.

## 2016-02-10 ENCOUNTER — Ambulatory Visit (INDEPENDENT_AMBULATORY_CARE_PROVIDER_SITE_OTHER): Payer: Medicare Other

## 2016-02-10 DIAGNOSIS — J309 Allergic rhinitis, unspecified: Secondary | ICD-10-CM | POA: Diagnosis not present

## 2016-02-11 ENCOUNTER — Telehealth: Payer: Self-pay | Admitting: Internal Medicine

## 2016-02-11 MED ORDER — AMOXICILLIN-POT CLAVULANATE 875-125 MG PO TABS: 1.0000 | ORAL_TABLET | Freq: Two times a day (BID) | ORAL | Status: AC

## 2016-02-11 NOTE — Telephone Encounter (Signed)
Spoke with pt, aware of recs.  rx sent to pharmacy.  Nothing further needed.  

## 2016-02-11 NOTE — Telephone Encounter (Signed)
Offer augmentin 875, # 14, 1 twice daily 

## 2016-02-11 NOTE — Telephone Encounter (Signed)
Pt c/o sinus drainage x 10 days with some sore throat. Denies cough and fever.  Using OTC Tylenol and Claraspray with Claritin D  Feels that her right tonsil are irritated and her right ear is becoming irritated along with her lymph nodes swelling.   Please advise Dr Annamaria Boots. Thanks.     Medication List       This list is accurate as of: 02/11/16 11:57 AM.  Always use your most recent med list.               benzonatate 200 MG capsule  Commonly known as:  TESSALON  Take 1 capsule (200 mg total) by mouth every 8 (eight) hours as needed for cough.     CENTRUM SILVER tablet  Take 1 tablet by mouth daily.     guaiFENesin 600 MG 12 hr tablet  Commonly known as:  MUCINEX  Take by mouth as needed.     NONFORMULARY OR COMPOUNDED ITEM  Allergy Vaccine 1:10 Given at [facility]     phenylephrine 10 MG Tabs tablet  Commonly known as:  SUDAFED PE  Take 10 mg by mouth every 4 (four) hours as needed. Reported on 08/11/2015     simvastatin 20 MG tablet  Commonly known as:  ZOCOR  Take 20 mg by mouth at bedtime.     Vitamin D3 3000 units Tabs  Take 4,000 Units by mouth daily.       Allergies  Allergen Reactions  . Dextromethorphan Other (See Comments)    Severe hot feeling, sweating, flushed,incoherent  . Codeine   . Darvon   . Propoxyphene Hcl

## 2016-02-17 ENCOUNTER — Ambulatory Visit (INDEPENDENT_AMBULATORY_CARE_PROVIDER_SITE_OTHER): Payer: Medicare Other

## 2016-02-17 DIAGNOSIS — J309 Allergic rhinitis, unspecified: Secondary | ICD-10-CM

## 2016-02-18 ENCOUNTER — Telehealth: Payer: Self-pay | Admitting: Internal Medicine

## 2016-02-18 NOTE — Telephone Encounter (Signed)
LMTCB

## 2016-02-18 NOTE — Telephone Encounter (Signed)
I put a note around her vials so whoever gives her a shot next time will know to drop back to 0.1 of 1:10. Nothing further needed. Closing.

## 2016-02-18 NOTE — Telephone Encounter (Signed)
She had gotten 0.4 ml/ vial, 1:10,  of both vial A& B   Plan- As discussed, reduce dose to 0.1 ml/ vial  f 1:10 and hold there

## 2016-02-18 NOTE — Telephone Encounter (Signed)
Per previous phone note: [name] [name] at 02/18/2016 1:37 PM     Status: Signed       Expand All Collapse All   I put a note around her vials so whoever gives her a shot next time will know to drop back to 0.1 of 1:10. Nothing further needed. Closing.            Deneise Lever, MD at 02/18/2016 1:00 PM     Status: Signed       Expand All Collapse All   She had gotten 0.4 ml/ vial, 1:10, of both vial A& B   Plan- As discussed, reduce dose to 0.1 ml/ vial f 1:10 and hold there      --  Called spoke with pt and is aware of above. She verbalized understanding and needed nothing further

## 2016-02-18 NOTE — Telephone Encounter (Signed)
Pt returning call from earlier.

## 2016-02-18 NOTE — Telephone Encounter (Signed)
Spoke with pt and she states that she got allergy shot yesterday afternoon. She reports that for the past several weeks she has been getting itchy areas on her neck after getting her allergy shots. The rash usually appears the next day. Areas are confined to her lower neck. Areas where injections are given are usually red and dime size but not itching. Pt reports that rash seems to last 3-4 days. Pt has not taken Benadryl for rash but has used topical cortisone cream. Pt does take Zyrtec daily.   CY Please advise. Thanks!   Allergies  Allergen Reactions  . Dextromethorphan Other (See Comments)    Severe hot feeling, sweating, flushed,incoherent  . Codeine   . Darvon   . Propoxyphene Hcl     Current Outpatient Prescriptions on File Prior to Visit  Medication Sig Dispense Refill  . amoxicillin-clavulanate (AUGMENTIN) 875-125 MG tablet Take 1 tablet by mouth 2 (two) times daily. 14 tablet 0  . benzonatate (TESSALON) 200 MG capsule Take 1 capsule (200 mg total) by mouth every 8 (eight) hours as needed for cough. 30 capsule 1  . Cholecalciferol (VITAMIN D3) 3000 UNITS TABS Take 4,000 Units by mouth daily.     Marland Kitchen guaiFENesin (MUCINEX) 600 MG 12 hr tablet Take by mouth as needed.    . Multiple Vitamins-Minerals (CENTRUM SILVER) tablet Take 1 tablet by mouth daily.    . NONFORMULARY OR COMPOUNDED ITEM Allergy Vaccine 1:10 Given at [facility]    . phenylephrine (SUDAFED PE) 10 MG TABS Take 10 mg by mouth every 4 (four) hours as needed. Reported on 08/11/2015    . simvastatin (ZOCOR) 20 MG tablet Take 20 mg by mouth at bedtime.      . [DISCONTINUED] mometasone (NASONEX) 50 MCG/ACT nasal spray Place 2 sprays into the nose daily. At bedtime 17 g 2   No current facility-administered medications on file prior to visit.

## 2016-02-19 NOTE — Telephone Encounter (Signed)
Spoke with pt and she is aware of dose change for allergy injections. Pt states that she took Claritin-D yesterday and rash has now resolved. Nothing further needed.

## 2016-02-25 ENCOUNTER — Ambulatory Visit (INDEPENDENT_AMBULATORY_CARE_PROVIDER_SITE_OTHER): Payer: Medicare Other | Admitting: *Deleted

## 2016-02-25 DIAGNOSIS — J309 Allergic rhinitis, unspecified: Secondary | ICD-10-CM

## 2016-03-02 ENCOUNTER — Ambulatory Visit (INDEPENDENT_AMBULATORY_CARE_PROVIDER_SITE_OTHER): Payer: Medicare Other | Admitting: *Deleted

## 2016-03-02 DIAGNOSIS — J309 Allergic rhinitis, unspecified: Secondary | ICD-10-CM

## 2016-03-09 ENCOUNTER — Ambulatory Visit (INDEPENDENT_AMBULATORY_CARE_PROVIDER_SITE_OTHER): Payer: Medicare Other | Admitting: *Deleted

## 2016-03-09 DIAGNOSIS — J309 Allergic rhinitis, unspecified: Secondary | ICD-10-CM

## 2016-03-12 DIAGNOSIS — M7672 Peroneal tendinitis, left leg: Secondary | ICD-10-CM | POA: Diagnosis not present

## 2016-03-16 ENCOUNTER — Ambulatory Visit (INDEPENDENT_AMBULATORY_CARE_PROVIDER_SITE_OTHER): Payer: Medicare Other | Admitting: *Deleted

## 2016-03-16 DIAGNOSIS — J309 Allergic rhinitis, unspecified: Secondary | ICD-10-CM

## 2016-03-23 ENCOUNTER — Ambulatory Visit (INDEPENDENT_AMBULATORY_CARE_PROVIDER_SITE_OTHER): Payer: Medicare Other | Admitting: *Deleted

## 2016-03-23 ENCOUNTER — Telehealth: Payer: Self-pay | Admitting: *Deleted

## 2016-03-23 DIAGNOSIS — J309 Allergic rhinitis, unspecified: Secondary | ICD-10-CM | POA: Diagnosis not present

## 2016-03-23 NOTE — Telephone Encounter (Signed)
[name] [name] experimented and she's pretty sure it's her shampoo that is causing her neck to breakout. She used the shampoo 2 or 3 different times. Each time her neck broke out. This last time she only brokeout on her the left side of her neck. It had been 5 or 6 days after she had her shot. She would like to slowly build back up to her tolerated dose. (0.4 of 1:10)

## 2016-03-23 NOTE — Telephone Encounter (Signed)
Yes, she threw that shampoo away.

## 2016-03-23 NOTE — Telephone Encounter (Signed)
Ok to re-build as suggested

## 2016-03-24 NOTE — Telephone Encounter (Signed)
Noted  

## 2016-03-30 ENCOUNTER — Ambulatory Visit (INDEPENDENT_AMBULATORY_CARE_PROVIDER_SITE_OTHER): Payer: Medicare Other | Admitting: *Deleted

## 2016-03-30 DIAGNOSIS — J309 Allergic rhinitis, unspecified: Secondary | ICD-10-CM | POA: Diagnosis not present

## 2016-04-06 ENCOUNTER — Ambulatory Visit: Payer: Medicare Other

## 2016-04-13 ENCOUNTER — Ambulatory Visit (INDEPENDENT_AMBULATORY_CARE_PROVIDER_SITE_OTHER): Payer: Medicare Other

## 2016-04-13 DIAGNOSIS — J309 Allergic rhinitis, unspecified: Secondary | ICD-10-CM | POA: Diagnosis not present

## 2016-04-14 ENCOUNTER — Telehealth: Payer: Self-pay | Admitting: Internal Medicine

## 2016-04-14 NOTE — Telephone Encounter (Signed)
Reccomend we stop allergy vaccine

## 2016-04-14 NOTE — Telephone Encounter (Signed)
Called and spoke with pt and she stated that her right arm was pink and about the size of a dime and the left arm was pink and about the size of a pencil eraser.  She was advised to call and make [place] aware.  Will forward to [employer] and to CY to advise of any recs.  thanks

## 2016-04-15 NOTE — Telephone Encounter (Signed)
Spoke with pt. She is aware of CY's recommendation. Nothing further was needed. 

## 2016-04-20 ENCOUNTER — Ambulatory Visit: Payer: Medicare Other

## 2016-04-22 DIAGNOSIS — M8580 Other specified disorders of bone density and structure, unspecified site: Secondary | ICD-10-CM | POA: Diagnosis not present

## 2016-04-22 DIAGNOSIS — Z1211 Encounter for screening for malignant neoplasm of colon: Secondary | ICD-10-CM | POA: Diagnosis not present

## 2016-04-22 DIAGNOSIS — E559 Vitamin D deficiency, unspecified: Secondary | ICD-10-CM | POA: Diagnosis not present

## 2016-04-22 DIAGNOSIS — Z0000 Encounter for general adult medical examination without abnormal findings: Secondary | ICD-10-CM | POA: Diagnosis not present

## 2016-04-22 DIAGNOSIS — E782 Mixed hyperlipidemia: Secondary | ICD-10-CM | POA: Diagnosis not present

## 2016-04-22 DIAGNOSIS — Z1389 Encounter for screening for other disorder: Secondary | ICD-10-CM | POA: Diagnosis not present

## 2016-04-27 DIAGNOSIS — M85852 Other specified disorders of bone density and structure, left thigh: Secondary | ICD-10-CM | POA: Diagnosis not present

## 2016-04-27 DIAGNOSIS — E782 Mixed hyperlipidemia: Secondary | ICD-10-CM | POA: Diagnosis not present

## 2016-04-27 DIAGNOSIS — E559 Vitamin D deficiency, unspecified: Secondary | ICD-10-CM | POA: Diagnosis not present

## 2016-04-27 DIAGNOSIS — Z0000 Encounter for general adult medical examination without abnormal findings: Secondary | ICD-10-CM | POA: Diagnosis not present

## 2016-04-27 DIAGNOSIS — Z6830 Body mass index (BMI) 30.0-30.9, adult: Secondary | ICD-10-CM | POA: Diagnosis not present

## 2016-04-27 DIAGNOSIS — Z1389 Encounter for screening for other disorder: Secondary | ICD-10-CM | POA: Diagnosis not present

## 2016-04-27 DIAGNOSIS — J309 Allergic rhinitis, unspecified: Secondary | ICD-10-CM | POA: Diagnosis not present

## 2016-04-27 DIAGNOSIS — E663 Overweight: Secondary | ICD-10-CM | POA: Diagnosis not present

## 2016-04-27 DIAGNOSIS — Z23 Encounter for immunization: Secondary | ICD-10-CM | POA: Diagnosis not present

## 2016-04-28 DIAGNOSIS — M7672 Peroneal tendinitis, left leg: Secondary | ICD-10-CM | POA: Diagnosis not present

## 2016-05-03 DIAGNOSIS — Z1211 Encounter for screening for malignant neoplasm of colon: Secondary | ICD-10-CM | POA: Diagnosis not present

## 2016-06-01 DIAGNOSIS — Z85828 Personal history of other malignant neoplasm of skin: Secondary | ICD-10-CM | POA: Diagnosis not present

## 2016-06-01 DIAGNOSIS — L603 Nail dystrophy: Secondary | ICD-10-CM | POA: Diagnosis not present

## 2016-06-01 DIAGNOSIS — Z23 Encounter for immunization: Secondary | ICD-10-CM | POA: Diagnosis not present

## 2016-06-11 DIAGNOSIS — H2513 Age-related nuclear cataract, bilateral: Secondary | ICD-10-CM | POA: Diagnosis not present

## 2016-06-11 DIAGNOSIS — H524 Presbyopia: Secondary | ICD-10-CM | POA: Diagnosis not present

## 2016-07-05 DIAGNOSIS — Z01411 Encounter for gynecological examination (general) (routine) with abnormal findings: Secondary | ICD-10-CM | POA: Diagnosis not present

## 2016-07-05 DIAGNOSIS — Z124 Encounter for screening for malignant neoplasm of cervix: Secondary | ICD-10-CM | POA: Diagnosis not present

## 2016-07-05 DIAGNOSIS — Z6829 Body mass index (BMI) 29.0-29.9, adult: Secondary | ICD-10-CM | POA: Diagnosis not present

## 2016-07-05 DIAGNOSIS — M8580 Other specified disorders of bone density and structure, unspecified site: Secondary | ICD-10-CM | POA: Diagnosis not present

## 2016-08-02 DIAGNOSIS — M810 Age-related osteoporosis without current pathological fracture: Secondary | ICD-10-CM | POA: Diagnosis not present

## 2016-08-24 ENCOUNTER — Ambulatory Visit: Payer: Medicare Other | Admitting: Internal Medicine

## 2016-09-06 ENCOUNTER — Ambulatory Visit: Payer: Medicare Other | Admitting: Internal Medicine

## 2016-09-06 DIAGNOSIS — M810 Age-related osteoporosis without current pathological fracture: Secondary | ICD-10-CM | POA: Diagnosis not present

## 2016-10-13 DIAGNOSIS — M810 Age-related osteoporosis without current pathological fracture: Secondary | ICD-10-CM | POA: Diagnosis not present

## 2017-03-03 DIAGNOSIS — Z1231 Encounter for screening mammogram for malignant neoplasm of breast: Secondary | ICD-10-CM | POA: Diagnosis not present

## 2017-04-19 DIAGNOSIS — Z6826 Body mass index (BMI) 26.0-26.9, adult: Secondary | ICD-10-CM | POA: Diagnosis not present

## 2017-04-19 DIAGNOSIS — E782 Mixed hyperlipidemia: Secondary | ICD-10-CM | POA: Diagnosis not present

## 2017-04-19 DIAGNOSIS — Z0000 Encounter for general adult medical examination without abnormal findings: Secondary | ICD-10-CM | POA: Diagnosis not present

## 2017-04-19 DIAGNOSIS — Z1389 Encounter for screening for other disorder: Secondary | ICD-10-CM | POA: Diagnosis not present

## 2017-04-19 DIAGNOSIS — M85852 Other specified disorders of bone density and structure, left thigh: Secondary | ICD-10-CM | POA: Diagnosis not present

## 2017-04-19 DIAGNOSIS — E663 Overweight: Secondary | ICD-10-CM | POA: Diagnosis not present

## 2017-04-19 DIAGNOSIS — J309 Allergic rhinitis, unspecified: Secondary | ICD-10-CM | POA: Diagnosis not present

## 2017-04-19 DIAGNOSIS — Z1211 Encounter for screening for malignant neoplasm of colon: Secondary | ICD-10-CM | POA: Diagnosis not present

## 2017-04-19 DIAGNOSIS — E559 Vitamin D deficiency, unspecified: Secondary | ICD-10-CM | POA: Diagnosis not present

## 2017-04-28 DIAGNOSIS — E782 Mixed hyperlipidemia: Secondary | ICD-10-CM | POA: Diagnosis not present

## 2017-04-28 DIAGNOSIS — J309 Allergic rhinitis, unspecified: Secondary | ICD-10-CM | POA: Diagnosis not present

## 2017-04-28 DIAGNOSIS — Z1389 Encounter for screening for other disorder: Secondary | ICD-10-CM | POA: Diagnosis not present

## 2017-04-28 DIAGNOSIS — Z1159 Encounter for screening for other viral diseases: Secondary | ICD-10-CM | POA: Diagnosis not present

## 2017-04-28 DIAGNOSIS — E663 Overweight: Secondary | ICD-10-CM | POA: Diagnosis not present

## 2017-04-28 DIAGNOSIS — E559 Vitamin D deficiency, unspecified: Secondary | ICD-10-CM | POA: Diagnosis not present

## 2017-04-28 DIAGNOSIS — Z6826 Body mass index (BMI) 26.0-26.9, adult: Secondary | ICD-10-CM | POA: Diagnosis not present

## 2017-04-28 DIAGNOSIS — M810 Age-related osteoporosis without current pathological fracture: Secondary | ICD-10-CM | POA: Diagnosis not present

## 2017-04-28 DIAGNOSIS — Z0000 Encounter for general adult medical examination without abnormal findings: Secondary | ICD-10-CM | POA: Diagnosis not present

## 2017-05-23 DIAGNOSIS — Z23 Encounter for immunization: Secondary | ICD-10-CM | POA: Diagnosis not present

## 2017-06-01 DIAGNOSIS — Z85828 Personal history of other malignant neoplasm of skin: Secondary | ICD-10-CM | POA: Diagnosis not present

## 2017-06-01 DIAGNOSIS — B079 Viral wart, unspecified: Secondary | ICD-10-CM | POA: Diagnosis not present

## 2017-06-01 DIAGNOSIS — D485 Neoplasm of uncertain behavior of skin: Secondary | ICD-10-CM | POA: Diagnosis not present

## 2017-06-01 DIAGNOSIS — Z23 Encounter for immunization: Secondary | ICD-10-CM | POA: Diagnosis not present

## 2017-06-01 DIAGNOSIS — L249 Irritant contact dermatitis, unspecified cause: Secondary | ICD-10-CM | POA: Diagnosis not present

## 2017-06-13 DIAGNOSIS — H524 Presbyopia: Secondary | ICD-10-CM | POA: Diagnosis not present

## 2017-06-13 DIAGNOSIS — H2513 Age-related nuclear cataract, bilateral: Secondary | ICD-10-CM | POA: Diagnosis not present

## 2017-06-20 DIAGNOSIS — M79604 Pain in right leg: Secondary | ICD-10-CM | POA: Diagnosis not present

## 2017-07-20 DIAGNOSIS — M7670 Peroneal tendinitis, unspecified leg: Secondary | ICD-10-CM | POA: Diagnosis not present

## 2017-08-19 DIAGNOSIS — Z124 Encounter for screening for malignant neoplasm of cervix: Secondary | ICD-10-CM | POA: Diagnosis not present

## 2017-08-19 DIAGNOSIS — Z01419 Encounter for gynecological examination (general) (routine) without abnormal findings: Secondary | ICD-10-CM | POA: Diagnosis not present

## 2017-11-23 DIAGNOSIS — L820 Inflamed seborrheic keratosis: Secondary | ICD-10-CM | POA: Diagnosis not present

## 2017-12-18 DIAGNOSIS — B349 Viral infection, unspecified: Secondary | ICD-10-CM | POA: Diagnosis not present

## 2017-12-18 DIAGNOSIS — J0140 Acute pansinusitis, unspecified: Secondary | ICD-10-CM | POA: Diagnosis not present

## 2018-01-05 DIAGNOSIS — J3089 Other allergic rhinitis: Secondary | ICD-10-CM | POA: Diagnosis not present

## 2018-01-05 DIAGNOSIS — J3081 Allergic rhinitis due to animal (cat) (dog) hair and dander: Secondary | ICD-10-CM | POA: Diagnosis not present

## 2018-01-05 DIAGNOSIS — J301 Allergic rhinitis due to pollen: Secondary | ICD-10-CM | POA: Diagnosis not present

## 2018-01-06 DIAGNOSIS — R1319 Other dysphagia: Secondary | ICD-10-CM | POA: Diagnosis not present

## 2018-01-12 ENCOUNTER — Other Ambulatory Visit: Payer: Self-pay | Admitting: Family Medicine

## 2018-01-12 DIAGNOSIS — R1319 Other dysphagia: Secondary | ICD-10-CM

## 2018-01-18 DIAGNOSIS — J3089 Other allergic rhinitis: Secondary | ICD-10-CM | POA: Diagnosis not present

## 2018-01-18 DIAGNOSIS — J3081 Allergic rhinitis due to animal (cat) (dog) hair and dander: Secondary | ICD-10-CM | POA: Diagnosis not present

## 2018-01-18 DIAGNOSIS — J301 Allergic rhinitis due to pollen: Secondary | ICD-10-CM | POA: Diagnosis not present

## 2018-01-19 ENCOUNTER — Ambulatory Visit
Admission: RE | Admit: 2018-01-19 | Discharge: 2018-01-19 | Disposition: A | Discharge status: Home / Self Care | Payer: Medicare Other | Source: Ambulatory Visit | Attending: Family Medicine | Admitting: Family Medicine

## 2018-01-19 DIAGNOSIS — R1310 Dysphagia, unspecified: Secondary | ICD-10-CM | POA: Diagnosis not present

## 2018-01-19 DIAGNOSIS — K439 Ventral hernia without obstruction or gangrene: Secondary | ICD-10-CM | POA: Diagnosis not present

## 2018-01-19 DIAGNOSIS — R1319 Other dysphagia: Secondary | ICD-10-CM

## 2018-01-24 DIAGNOSIS — J3081 Allergic rhinitis due to animal (cat) (dog) hair and dander: Secondary | ICD-10-CM | POA: Diagnosis not present

## 2018-01-24 DIAGNOSIS — J301 Allergic rhinitis due to pollen: Secondary | ICD-10-CM | POA: Diagnosis not present

## 2018-01-24 DIAGNOSIS — J3089 Other allergic rhinitis: Secondary | ICD-10-CM | POA: Diagnosis not present

## 2018-01-26 DIAGNOSIS — J3081 Allergic rhinitis due to animal (cat) (dog) hair and dander: Secondary | ICD-10-CM | POA: Diagnosis not present

## 2018-01-26 DIAGNOSIS — J3089 Other allergic rhinitis: Secondary | ICD-10-CM | POA: Diagnosis not present

## 2018-01-26 DIAGNOSIS — J301 Allergic rhinitis due to pollen: Secondary | ICD-10-CM | POA: Diagnosis not present

## 2018-01-31 DIAGNOSIS — J301 Allergic rhinitis due to pollen: Secondary | ICD-10-CM | POA: Diagnosis not present

## 2018-01-31 DIAGNOSIS — J3081 Allergic rhinitis due to animal (cat) (dog) hair and dander: Secondary | ICD-10-CM | POA: Diagnosis not present

## 2018-01-31 DIAGNOSIS — J3089 Other allergic rhinitis: Secondary | ICD-10-CM | POA: Diagnosis not present

## 2018-02-02 DIAGNOSIS — J3081 Allergic rhinitis due to animal (cat) (dog) hair and dander: Secondary | ICD-10-CM | POA: Diagnosis not present

## 2018-02-02 DIAGNOSIS — J301 Allergic rhinitis due to pollen: Secondary | ICD-10-CM | POA: Diagnosis not present

## 2018-02-02 DIAGNOSIS — J3089 Other allergic rhinitis: Secondary | ICD-10-CM | POA: Diagnosis not present

## 2018-02-07 DIAGNOSIS — J3089 Other allergic rhinitis: Secondary | ICD-10-CM | POA: Diagnosis not present

## 2018-02-07 DIAGNOSIS — J301 Allergic rhinitis due to pollen: Secondary | ICD-10-CM | POA: Diagnosis not present

## 2018-02-07 DIAGNOSIS — J3081 Allergic rhinitis due to animal (cat) (dog) hair and dander: Secondary | ICD-10-CM | POA: Diagnosis not present

## 2018-02-14 DIAGNOSIS — J3089 Other allergic rhinitis: Secondary | ICD-10-CM | POA: Diagnosis not present

## 2018-02-14 DIAGNOSIS — J3081 Allergic rhinitis due to animal (cat) (dog) hair and dander: Secondary | ICD-10-CM | POA: Diagnosis not present

## 2018-02-14 DIAGNOSIS — J301 Allergic rhinitis due to pollen: Secondary | ICD-10-CM | POA: Diagnosis not present

## 2018-02-16 DIAGNOSIS — Z1211 Encounter for screening for malignant neoplasm of colon: Secondary | ICD-10-CM | POA: Diagnosis not present

## 2018-02-16 DIAGNOSIS — R1310 Dysphagia, unspecified: Secondary | ICD-10-CM | POA: Diagnosis not present

## 2018-02-16 DIAGNOSIS — R933 Abnormal findings on diagnostic imaging of other parts of digestive tract: Secondary | ICD-10-CM | POA: Diagnosis not present

## 2018-02-21 DIAGNOSIS — J3081 Allergic rhinitis due to animal (cat) (dog) hair and dander: Secondary | ICD-10-CM | POA: Diagnosis not present

## 2018-02-21 DIAGNOSIS — J3089 Other allergic rhinitis: Secondary | ICD-10-CM | POA: Diagnosis not present

## 2018-02-21 DIAGNOSIS — J301 Allergic rhinitis due to pollen: Secondary | ICD-10-CM | POA: Diagnosis not present

## 2018-02-28 DIAGNOSIS — J3089 Other allergic rhinitis: Secondary | ICD-10-CM | POA: Diagnosis not present

## 2018-02-28 DIAGNOSIS — J301 Allergic rhinitis due to pollen: Secondary | ICD-10-CM | POA: Diagnosis not present

## 2018-02-28 DIAGNOSIS — J3081 Allergic rhinitis due to animal (cat) (dog) hair and dander: Secondary | ICD-10-CM | POA: Diagnosis not present

## 2018-03-07 DIAGNOSIS — J3081 Allergic rhinitis due to animal (cat) (dog) hair and dander: Secondary | ICD-10-CM | POA: Diagnosis not present

## 2018-03-07 DIAGNOSIS — J3089 Other allergic rhinitis: Secondary | ICD-10-CM | POA: Diagnosis not present

## 2018-03-07 DIAGNOSIS — J301 Allergic rhinitis due to pollen: Secondary | ICD-10-CM | POA: Diagnosis not present

## 2018-03-08 DIAGNOSIS — R933 Abnormal findings on diagnostic imaging of other parts of digestive tract: Secondary | ICD-10-CM | POA: Diagnosis not present

## 2018-03-08 DIAGNOSIS — R1310 Dysphagia, unspecified: Secondary | ICD-10-CM | POA: Diagnosis not present

## 2018-03-08 DIAGNOSIS — K449 Diaphragmatic hernia without obstruction or gangrene: Secondary | ICD-10-CM | POA: Diagnosis not present

## 2018-03-08 DIAGNOSIS — K2970 Gastritis, unspecified, without bleeding: Secondary | ICD-10-CM | POA: Diagnosis not present

## 2018-03-14 DIAGNOSIS — J3089 Other allergic rhinitis: Secondary | ICD-10-CM | POA: Diagnosis not present

## 2018-03-14 DIAGNOSIS — J3081 Allergic rhinitis due to animal (cat) (dog) hair and dander: Secondary | ICD-10-CM | POA: Diagnosis not present

## 2018-03-14 DIAGNOSIS — J301 Allergic rhinitis due to pollen: Secondary | ICD-10-CM | POA: Diagnosis not present

## 2018-03-21 DIAGNOSIS — J3089 Other allergic rhinitis: Secondary | ICD-10-CM | POA: Diagnosis not present

## 2018-03-21 DIAGNOSIS — J3081 Allergic rhinitis due to animal (cat) (dog) hair and dander: Secondary | ICD-10-CM | POA: Diagnosis not present

## 2018-03-21 DIAGNOSIS — J301 Allergic rhinitis due to pollen: Secondary | ICD-10-CM | POA: Diagnosis not present

## 2018-03-28 DIAGNOSIS — J301 Allergic rhinitis due to pollen: Secondary | ICD-10-CM | POA: Diagnosis not present

## 2018-03-28 DIAGNOSIS — J3089 Other allergic rhinitis: Secondary | ICD-10-CM | POA: Diagnosis not present

## 2018-03-28 DIAGNOSIS — J3081 Allergic rhinitis due to animal (cat) (dog) hair and dander: Secondary | ICD-10-CM | POA: Diagnosis not present

## 2018-04-04 DIAGNOSIS — J301 Allergic rhinitis due to pollen: Secondary | ICD-10-CM | POA: Diagnosis not present

## 2018-04-04 DIAGNOSIS — J3089 Other allergic rhinitis: Secondary | ICD-10-CM | POA: Diagnosis not present

## 2018-04-04 DIAGNOSIS — J3081 Allergic rhinitis due to animal (cat) (dog) hair and dander: Secondary | ICD-10-CM | POA: Diagnosis not present

## 2018-04-10 DIAGNOSIS — J019 Acute sinusitis, unspecified: Secondary | ICD-10-CM | POA: Diagnosis not present

## 2018-04-10 DIAGNOSIS — J301 Allergic rhinitis due to pollen: Secondary | ICD-10-CM | POA: Diagnosis not present

## 2018-04-10 DIAGNOSIS — J3089 Other allergic rhinitis: Secondary | ICD-10-CM | POA: Diagnosis not present

## 2018-04-10 DIAGNOSIS — J3081 Allergic rhinitis due to animal (cat) (dog) hair and dander: Secondary | ICD-10-CM | POA: Diagnosis not present

## 2018-04-18 DIAGNOSIS — J301 Allergic rhinitis due to pollen: Secondary | ICD-10-CM | POA: Diagnosis not present

## 2018-04-18 DIAGNOSIS — J3089 Other allergic rhinitis: Secondary | ICD-10-CM | POA: Diagnosis not present

## 2018-04-18 DIAGNOSIS — J3081 Allergic rhinitis due to animal (cat) (dog) hair and dander: Secondary | ICD-10-CM | POA: Diagnosis not present

## 2018-04-21 DIAGNOSIS — J3089 Other allergic rhinitis: Secondary | ICD-10-CM | POA: Diagnosis not present

## 2018-04-21 DIAGNOSIS — J3081 Allergic rhinitis due to animal (cat) (dog) hair and dander: Secondary | ICD-10-CM | POA: Diagnosis not present

## 2018-04-21 DIAGNOSIS — J301 Allergic rhinitis due to pollen: Secondary | ICD-10-CM | POA: Diagnosis not present

## 2018-04-25 DIAGNOSIS — J301 Allergic rhinitis due to pollen: Secondary | ICD-10-CM | POA: Diagnosis not present

## 2018-04-25 DIAGNOSIS — J3081 Allergic rhinitis due to animal (cat) (dog) hair and dander: Secondary | ICD-10-CM | POA: Diagnosis not present

## 2018-04-25 DIAGNOSIS — J3089 Other allergic rhinitis: Secondary | ICD-10-CM | POA: Diagnosis not present

## 2018-04-28 DIAGNOSIS — J301 Allergic rhinitis due to pollen: Secondary | ICD-10-CM | POA: Diagnosis not present

## 2018-04-28 DIAGNOSIS — J3089 Other allergic rhinitis: Secondary | ICD-10-CM | POA: Diagnosis not present

## 2018-04-28 DIAGNOSIS — J3081 Allergic rhinitis due to animal (cat) (dog) hair and dander: Secondary | ICD-10-CM | POA: Diagnosis not present

## 2018-05-23 DIAGNOSIS — J3081 Allergic rhinitis due to animal (cat) (dog) hair and dander: Secondary | ICD-10-CM | POA: Diagnosis not present

## 2018-05-23 DIAGNOSIS — J301 Allergic rhinitis due to pollen: Secondary | ICD-10-CM | POA: Diagnosis not present

## 2018-05-23 DIAGNOSIS — J3089 Other allergic rhinitis: Secondary | ICD-10-CM | POA: Diagnosis not present

## 2018-05-26 DIAGNOSIS — J3081 Allergic rhinitis due to animal (cat) (dog) hair and dander: Secondary | ICD-10-CM | POA: Diagnosis not present

## 2018-05-26 DIAGNOSIS — J3089 Other allergic rhinitis: Secondary | ICD-10-CM | POA: Diagnosis not present

## 2018-05-26 DIAGNOSIS — J301 Allergic rhinitis due to pollen: Secondary | ICD-10-CM | POA: Diagnosis not present

## 2018-05-29 DIAGNOSIS — Z23 Encounter for immunization: Secondary | ICD-10-CM | POA: Diagnosis not present

## 2018-05-30 DIAGNOSIS — J3089 Other allergic rhinitis: Secondary | ICD-10-CM | POA: Diagnosis not present

## 2018-05-30 DIAGNOSIS — J3081 Allergic rhinitis due to animal (cat) (dog) hair and dander: Secondary | ICD-10-CM | POA: Diagnosis not present

## 2018-05-30 DIAGNOSIS — J301 Allergic rhinitis due to pollen: Secondary | ICD-10-CM | POA: Diagnosis not present

## 2018-06-05 DIAGNOSIS — J3081 Allergic rhinitis due to animal (cat) (dog) hair and dander: Secondary | ICD-10-CM | POA: Diagnosis not present

## 2018-06-05 DIAGNOSIS — J301 Allergic rhinitis due to pollen: Secondary | ICD-10-CM | POA: Diagnosis not present

## 2018-06-05 DIAGNOSIS — J3089 Other allergic rhinitis: Secondary | ICD-10-CM | POA: Diagnosis not present

## 2018-06-09 DIAGNOSIS — J3089 Other allergic rhinitis: Secondary | ICD-10-CM | POA: Diagnosis not present

## 2018-06-09 DIAGNOSIS — J3081 Allergic rhinitis due to animal (cat) (dog) hair and dander: Secondary | ICD-10-CM | POA: Diagnosis not present

## 2018-06-09 DIAGNOSIS — J301 Allergic rhinitis due to pollen: Secondary | ICD-10-CM | POA: Diagnosis not present

## 2018-06-10 DIAGNOSIS — J309 Allergic rhinitis, unspecified: Secondary | ICD-10-CM | POA: Diagnosis not present

## 2018-06-13 DIAGNOSIS — J3081 Allergic rhinitis due to animal (cat) (dog) hair and dander: Secondary | ICD-10-CM | POA: Diagnosis not present

## 2018-06-13 DIAGNOSIS — J301 Allergic rhinitis due to pollen: Secondary | ICD-10-CM | POA: Diagnosis not present

## 2018-06-13 DIAGNOSIS — J3089 Other allergic rhinitis: Secondary | ICD-10-CM | POA: Diagnosis not present

## 2018-06-15 DIAGNOSIS — H2513 Age-related nuclear cataract, bilateral: Secondary | ICD-10-CM | POA: Diagnosis not present

## 2018-06-15 DIAGNOSIS — H5212 Myopia, left eye: Secondary | ICD-10-CM | POA: Diagnosis not present

## 2018-06-15 DIAGNOSIS — H5201 Hypermetropia, right eye: Secondary | ICD-10-CM | POA: Diagnosis not present

## 2018-06-16 DIAGNOSIS — J3089 Other allergic rhinitis: Secondary | ICD-10-CM | POA: Diagnosis not present

## 2018-06-16 DIAGNOSIS — J301 Allergic rhinitis due to pollen: Secondary | ICD-10-CM | POA: Diagnosis not present

## 2018-06-16 DIAGNOSIS — J3081 Allergic rhinitis due to animal (cat) (dog) hair and dander: Secondary | ICD-10-CM | POA: Diagnosis not present

## 2018-06-19 DIAGNOSIS — J3089 Other allergic rhinitis: Secondary | ICD-10-CM | POA: Diagnosis not present

## 2018-06-19 DIAGNOSIS — J3081 Allergic rhinitis due to animal (cat) (dog) hair and dander: Secondary | ICD-10-CM | POA: Diagnosis not present

## 2018-06-19 DIAGNOSIS — J301 Allergic rhinitis due to pollen: Secondary | ICD-10-CM | POA: Diagnosis not present

## 2018-06-23 DIAGNOSIS — J3081 Allergic rhinitis due to animal (cat) (dog) hair and dander: Secondary | ICD-10-CM | POA: Diagnosis not present

## 2018-06-23 DIAGNOSIS — J301 Allergic rhinitis due to pollen: Secondary | ICD-10-CM | POA: Diagnosis not present

## 2018-06-23 DIAGNOSIS — J3089 Other allergic rhinitis: Secondary | ICD-10-CM | POA: Diagnosis not present

## 2018-06-26 DIAGNOSIS — E559 Vitamin D deficiency, unspecified: Secondary | ICD-10-CM | POA: Diagnosis not present

## 2018-06-26 DIAGNOSIS — J301 Allergic rhinitis due to pollen: Secondary | ICD-10-CM | POA: Diagnosis not present

## 2018-06-26 DIAGNOSIS — J3081 Allergic rhinitis due to animal (cat) (dog) hair and dander: Secondary | ICD-10-CM | POA: Diagnosis not present

## 2018-06-26 DIAGNOSIS — J309 Allergic rhinitis, unspecified: Secondary | ICD-10-CM | POA: Diagnosis not present

## 2018-06-26 DIAGNOSIS — Z0000 Encounter for general adult medical examination without abnormal findings: Secondary | ICD-10-CM | POA: Diagnosis not present

## 2018-06-26 DIAGNOSIS — M810 Age-related osteoporosis without current pathological fracture: Secondary | ICD-10-CM | POA: Diagnosis not present

## 2018-06-26 DIAGNOSIS — E663 Overweight: Secondary | ICD-10-CM | POA: Diagnosis not present

## 2018-06-26 DIAGNOSIS — Z1159 Encounter for screening for other viral diseases: Secondary | ICD-10-CM | POA: Diagnosis not present

## 2018-06-26 DIAGNOSIS — Z1389 Encounter for screening for other disorder: Secondary | ICD-10-CM | POA: Diagnosis not present

## 2018-06-26 DIAGNOSIS — J3089 Other allergic rhinitis: Secondary | ICD-10-CM | POA: Diagnosis not present

## 2018-06-26 DIAGNOSIS — E782 Mixed hyperlipidemia: Secondary | ICD-10-CM | POA: Diagnosis not present

## 2018-07-05 DIAGNOSIS — J301 Allergic rhinitis due to pollen: Secondary | ICD-10-CM | POA: Diagnosis not present

## 2018-07-05 DIAGNOSIS — J3081 Allergic rhinitis due to animal (cat) (dog) hair and dander: Secondary | ICD-10-CM | POA: Diagnosis not present

## 2018-07-05 DIAGNOSIS — J3089 Other allergic rhinitis: Secondary | ICD-10-CM | POA: Diagnosis not present

## 2018-07-07 DIAGNOSIS — J301 Allergic rhinitis due to pollen: Secondary | ICD-10-CM | POA: Diagnosis not present

## 2018-07-07 DIAGNOSIS — J3089 Other allergic rhinitis: Secondary | ICD-10-CM | POA: Diagnosis not present

## 2018-07-07 DIAGNOSIS — J3081 Allergic rhinitis due to animal (cat) (dog) hair and dander: Secondary | ICD-10-CM | POA: Diagnosis not present

## 2018-07-10 DIAGNOSIS — E782 Mixed hyperlipidemia: Secondary | ICD-10-CM | POA: Diagnosis not present

## 2018-07-10 DIAGNOSIS — Z23 Encounter for immunization: Secondary | ICD-10-CM | POA: Diagnosis not present

## 2018-07-10 DIAGNOSIS — Z8719 Personal history of other diseases of the digestive system: Secondary | ICD-10-CM | POA: Diagnosis not present

## 2018-07-10 DIAGNOSIS — E663 Overweight: Secondary | ICD-10-CM | POA: Diagnosis not present

## 2018-07-10 DIAGNOSIS — R1310 Dysphagia, unspecified: Secondary | ICD-10-CM | POA: Diagnosis not present

## 2018-07-10 DIAGNOSIS — E559 Vitamin D deficiency, unspecified: Secondary | ICD-10-CM | POA: Diagnosis not present

## 2018-07-10 DIAGNOSIS — J309 Allergic rhinitis, unspecified: Secondary | ICD-10-CM | POA: Diagnosis not present

## 2018-07-10 DIAGNOSIS — M810 Age-related osteoporosis without current pathological fracture: Secondary | ICD-10-CM | POA: Diagnosis not present

## 2018-07-11 DIAGNOSIS — J3081 Allergic rhinitis due to animal (cat) (dog) hair and dander: Secondary | ICD-10-CM | POA: Diagnosis not present

## 2018-07-11 DIAGNOSIS — J3089 Other allergic rhinitis: Secondary | ICD-10-CM | POA: Diagnosis not present

## 2018-07-11 DIAGNOSIS — J301 Allergic rhinitis due to pollen: Secondary | ICD-10-CM | POA: Diagnosis not present

## 2018-07-14 DIAGNOSIS — J3081 Allergic rhinitis due to animal (cat) (dog) hair and dander: Secondary | ICD-10-CM | POA: Diagnosis not present

## 2018-07-14 DIAGNOSIS — J301 Allergic rhinitis due to pollen: Secondary | ICD-10-CM | POA: Diagnosis not present

## 2018-07-18 DIAGNOSIS — J3089 Other allergic rhinitis: Secondary | ICD-10-CM | POA: Diagnosis not present

## 2018-07-18 DIAGNOSIS — J301 Allergic rhinitis due to pollen: Secondary | ICD-10-CM | POA: Diagnosis not present

## 2018-07-18 DIAGNOSIS — J3081 Allergic rhinitis due to animal (cat) (dog) hair and dander: Secondary | ICD-10-CM | POA: Diagnosis not present

## 2018-07-24 DIAGNOSIS — J3081 Allergic rhinitis due to animal (cat) (dog) hair and dander: Secondary | ICD-10-CM | POA: Diagnosis not present

## 2018-07-24 DIAGNOSIS — J301 Allergic rhinitis due to pollen: Secondary | ICD-10-CM | POA: Diagnosis not present

## 2018-07-24 DIAGNOSIS — J3089 Other allergic rhinitis: Secondary | ICD-10-CM | POA: Diagnosis not present

## 2018-07-31 DIAGNOSIS — J0110 Acute frontal sinusitis, unspecified: Secondary | ICD-10-CM | POA: Diagnosis not present

## 2018-07-31 DIAGNOSIS — J069 Acute upper respiratory infection, unspecified: Secondary | ICD-10-CM | POA: Diagnosis not present

## 2018-08-02 DIAGNOSIS — J3081 Allergic rhinitis due to animal (cat) (dog) hair and dander: Secondary | ICD-10-CM | POA: Diagnosis not present

## 2018-08-02 DIAGNOSIS — J019 Acute sinusitis, unspecified: Secondary | ICD-10-CM | POA: Diagnosis not present

## 2018-08-02 DIAGNOSIS — J3089 Other allergic rhinitis: Secondary | ICD-10-CM | POA: Diagnosis not present

## 2018-08-02 DIAGNOSIS — J301 Allergic rhinitis due to pollen: Secondary | ICD-10-CM | POA: Diagnosis not present

## 2018-08-08 DIAGNOSIS — J3081 Allergic rhinitis due to animal (cat) (dog) hair and dander: Secondary | ICD-10-CM | POA: Diagnosis not present

## 2018-08-08 DIAGNOSIS — J3089 Other allergic rhinitis: Secondary | ICD-10-CM | POA: Diagnosis not present

## 2018-08-08 DIAGNOSIS — J301 Allergic rhinitis due to pollen: Secondary | ICD-10-CM | POA: Diagnosis not present

## 2018-08-10 DIAGNOSIS — Z23 Encounter for immunization: Secondary | ICD-10-CM | POA: Diagnosis not present

## 2018-08-10 DIAGNOSIS — L570 Actinic keratosis: Secondary | ICD-10-CM | POA: Diagnosis not present

## 2018-08-10 DIAGNOSIS — L821 Other seborrheic keratosis: Secondary | ICD-10-CM | POA: Diagnosis not present

## 2018-08-10 DIAGNOSIS — Z85828 Personal history of other malignant neoplasm of skin: Secondary | ICD-10-CM | POA: Diagnosis not present

## 2018-08-11 DIAGNOSIS — J3081 Allergic rhinitis due to animal (cat) (dog) hair and dander: Secondary | ICD-10-CM | POA: Diagnosis not present

## 2018-08-11 DIAGNOSIS — J3089 Other allergic rhinitis: Secondary | ICD-10-CM | POA: Diagnosis not present

## 2018-08-11 DIAGNOSIS — J301 Allergic rhinitis due to pollen: Secondary | ICD-10-CM | POA: Diagnosis not present

## 2018-08-14 DIAGNOSIS — J3089 Other allergic rhinitis: Secondary | ICD-10-CM | POA: Diagnosis not present

## 2018-08-14 DIAGNOSIS — J301 Allergic rhinitis due to pollen: Secondary | ICD-10-CM | POA: Diagnosis not present

## 2018-08-14 DIAGNOSIS — J3081 Allergic rhinitis due to animal (cat) (dog) hair and dander: Secondary | ICD-10-CM | POA: Diagnosis not present

## 2018-08-18 DIAGNOSIS — J301 Allergic rhinitis due to pollen: Secondary | ICD-10-CM | POA: Diagnosis not present

## 2018-08-18 DIAGNOSIS — J3089 Other allergic rhinitis: Secondary | ICD-10-CM | POA: Diagnosis not present

## 2018-08-18 DIAGNOSIS — J3081 Allergic rhinitis due to animal (cat) (dog) hair and dander: Secondary | ICD-10-CM | POA: Diagnosis not present

## 2018-08-21 DIAGNOSIS — J3089 Other allergic rhinitis: Secondary | ICD-10-CM | POA: Diagnosis not present

## 2018-08-21 DIAGNOSIS — J301 Allergic rhinitis due to pollen: Secondary | ICD-10-CM | POA: Diagnosis not present

## 2018-08-21 DIAGNOSIS — J3081 Allergic rhinitis due to animal (cat) (dog) hair and dander: Secondary | ICD-10-CM | POA: Diagnosis not present

## 2018-08-22 DIAGNOSIS — J301 Allergic rhinitis due to pollen: Secondary | ICD-10-CM | POA: Diagnosis not present

## 2018-08-22 DIAGNOSIS — J3081 Allergic rhinitis due to animal (cat) (dog) hair and dander: Secondary | ICD-10-CM | POA: Diagnosis not present

## 2018-08-22 DIAGNOSIS — J3089 Other allergic rhinitis: Secondary | ICD-10-CM | POA: Diagnosis not present

## 2018-08-28 DIAGNOSIS — J3089 Other allergic rhinitis: Secondary | ICD-10-CM | POA: Diagnosis not present

## 2018-08-28 DIAGNOSIS — J301 Allergic rhinitis due to pollen: Secondary | ICD-10-CM | POA: Diagnosis not present

## 2018-08-28 DIAGNOSIS — J3081 Allergic rhinitis due to animal (cat) (dog) hair and dander: Secondary | ICD-10-CM | POA: Diagnosis not present

## 2018-09-04 DIAGNOSIS — J3081 Allergic rhinitis due to animal (cat) (dog) hair and dander: Secondary | ICD-10-CM | POA: Diagnosis not present

## 2018-09-04 DIAGNOSIS — J3089 Other allergic rhinitis: Secondary | ICD-10-CM | POA: Diagnosis not present

## 2018-09-04 DIAGNOSIS — J301 Allergic rhinitis due to pollen: Secondary | ICD-10-CM | POA: Diagnosis not present

## 2018-09-12 DIAGNOSIS — J3089 Other allergic rhinitis: Secondary | ICD-10-CM | POA: Diagnosis not present

## 2018-09-12 DIAGNOSIS — J3081 Allergic rhinitis due to animal (cat) (dog) hair and dander: Secondary | ICD-10-CM | POA: Diagnosis not present

## 2018-09-12 DIAGNOSIS — J301 Allergic rhinitis due to pollen: Secondary | ICD-10-CM | POA: Diagnosis not present

## 2018-09-19 DIAGNOSIS — J3081 Allergic rhinitis due to animal (cat) (dog) hair and dander: Secondary | ICD-10-CM | POA: Diagnosis not present

## 2018-09-19 DIAGNOSIS — J301 Allergic rhinitis due to pollen: Secondary | ICD-10-CM | POA: Diagnosis not present

## 2018-09-19 DIAGNOSIS — J3089 Other allergic rhinitis: Secondary | ICD-10-CM | POA: Diagnosis not present

## 2018-09-25 DIAGNOSIS — Z1231 Encounter for screening mammogram for malignant neoplasm of breast: Secondary | ICD-10-CM | POA: Diagnosis not present

## 2018-09-25 DIAGNOSIS — J301 Allergic rhinitis due to pollen: Secondary | ICD-10-CM | POA: Diagnosis not present

## 2018-09-25 DIAGNOSIS — J3089 Other allergic rhinitis: Secondary | ICD-10-CM | POA: Diagnosis not present

## 2018-09-25 DIAGNOSIS — J3081 Allergic rhinitis due to animal (cat) (dog) hair and dander: Secondary | ICD-10-CM | POA: Diagnosis not present

## 2018-10-03 DIAGNOSIS — J301 Allergic rhinitis due to pollen: Secondary | ICD-10-CM | POA: Diagnosis not present

## 2018-10-03 DIAGNOSIS — J3089 Other allergic rhinitis: Secondary | ICD-10-CM | POA: Diagnosis not present

## 2018-10-03 DIAGNOSIS — J3081 Allergic rhinitis due to animal (cat) (dog) hair and dander: Secondary | ICD-10-CM | POA: Diagnosis not present

## 2018-10-10 DIAGNOSIS — J301 Allergic rhinitis due to pollen: Secondary | ICD-10-CM | POA: Diagnosis not present

## 2018-10-10 DIAGNOSIS — J3089 Other allergic rhinitis: Secondary | ICD-10-CM | POA: Diagnosis not present

## 2018-10-10 DIAGNOSIS — J3081 Allergic rhinitis due to animal (cat) (dog) hair and dander: Secondary | ICD-10-CM | POA: Diagnosis not present

## 2018-10-18 DIAGNOSIS — J3081 Allergic rhinitis due to animal (cat) (dog) hair and dander: Secondary | ICD-10-CM | POA: Diagnosis not present

## 2018-10-18 DIAGNOSIS — J301 Allergic rhinitis due to pollen: Secondary | ICD-10-CM | POA: Diagnosis not present

## 2018-10-18 DIAGNOSIS — J3089 Other allergic rhinitis: Secondary | ICD-10-CM | POA: Diagnosis not present

## 2018-10-23 DIAGNOSIS — J3089 Other allergic rhinitis: Secondary | ICD-10-CM | POA: Diagnosis not present

## 2018-10-23 DIAGNOSIS — J3081 Allergic rhinitis due to animal (cat) (dog) hair and dander: Secondary | ICD-10-CM | POA: Diagnosis not present

## 2018-10-23 DIAGNOSIS — M8589 Other specified disorders of bone density and structure, multiple sites: Secondary | ICD-10-CM | POA: Diagnosis not present

## 2018-10-23 DIAGNOSIS — J301 Allergic rhinitis due to pollen: Secondary | ICD-10-CM | POA: Diagnosis not present

## 2018-10-23 DIAGNOSIS — Z8262 Family history of osteoporosis: Secondary | ICD-10-CM | POA: Diagnosis not present

## 2018-10-31 DIAGNOSIS — J3089 Other allergic rhinitis: Secondary | ICD-10-CM | POA: Diagnosis not present

## 2018-10-31 DIAGNOSIS — J3081 Allergic rhinitis due to animal (cat) (dog) hair and dander: Secondary | ICD-10-CM | POA: Diagnosis not present

## 2018-10-31 DIAGNOSIS — J301 Allergic rhinitis due to pollen: Secondary | ICD-10-CM | POA: Diagnosis not present

## 2018-11-07 DIAGNOSIS — J3081 Allergic rhinitis due to animal (cat) (dog) hair and dander: Secondary | ICD-10-CM | POA: Diagnosis not present

## 2018-11-07 DIAGNOSIS — J301 Allergic rhinitis due to pollen: Secondary | ICD-10-CM | POA: Diagnosis not present

## 2018-11-07 DIAGNOSIS — J3089 Other allergic rhinitis: Secondary | ICD-10-CM | POA: Diagnosis not present

## 2018-11-14 DIAGNOSIS — J3089 Other allergic rhinitis: Secondary | ICD-10-CM | POA: Diagnosis not present

## 2018-11-14 DIAGNOSIS — J301 Allergic rhinitis due to pollen: Secondary | ICD-10-CM | POA: Diagnosis not present

## 2018-11-14 DIAGNOSIS — J3081 Allergic rhinitis due to animal (cat) (dog) hair and dander: Secondary | ICD-10-CM | POA: Diagnosis not present

## 2018-11-23 DIAGNOSIS — J301 Allergic rhinitis due to pollen: Secondary | ICD-10-CM | POA: Diagnosis not present

## 2018-11-23 DIAGNOSIS — J3089 Other allergic rhinitis: Secondary | ICD-10-CM | POA: Diagnosis not present

## 2018-11-23 DIAGNOSIS — J3081 Allergic rhinitis due to animal (cat) (dog) hair and dander: Secondary | ICD-10-CM | POA: Diagnosis not present

## 2018-11-28 DIAGNOSIS — J3081 Allergic rhinitis due to animal (cat) (dog) hair and dander: Secondary | ICD-10-CM | POA: Diagnosis not present

## 2018-11-28 DIAGNOSIS — J301 Allergic rhinitis due to pollen: Secondary | ICD-10-CM | POA: Diagnosis not present

## 2018-11-28 DIAGNOSIS — J3089 Other allergic rhinitis: Secondary | ICD-10-CM | POA: Diagnosis not present

## 2018-12-05 DIAGNOSIS — J3089 Other allergic rhinitis: Secondary | ICD-10-CM | POA: Diagnosis not present

## 2018-12-05 DIAGNOSIS — J3081 Allergic rhinitis due to animal (cat) (dog) hair and dander: Secondary | ICD-10-CM | POA: Diagnosis not present

## 2018-12-05 DIAGNOSIS — J301 Allergic rhinitis due to pollen: Secondary | ICD-10-CM | POA: Diagnosis not present

## 2018-12-14 DIAGNOSIS — J3089 Other allergic rhinitis: Secondary | ICD-10-CM | POA: Diagnosis not present

## 2018-12-14 DIAGNOSIS — J301 Allergic rhinitis due to pollen: Secondary | ICD-10-CM | POA: Diagnosis not present

## 2018-12-14 DIAGNOSIS — J3081 Allergic rhinitis due to animal (cat) (dog) hair and dander: Secondary | ICD-10-CM | POA: Diagnosis not present

## 2018-12-20 DIAGNOSIS — J301 Allergic rhinitis due to pollen: Secondary | ICD-10-CM | POA: Diagnosis not present

## 2018-12-20 DIAGNOSIS — J3089 Other allergic rhinitis: Secondary | ICD-10-CM | POA: Diagnosis not present

## 2018-12-20 DIAGNOSIS — J3081 Allergic rhinitis due to animal (cat) (dog) hair and dander: Secondary | ICD-10-CM | POA: Diagnosis not present

## 2018-12-26 DIAGNOSIS — J3089 Other allergic rhinitis: Secondary | ICD-10-CM | POA: Diagnosis not present

## 2018-12-26 DIAGNOSIS — J3081 Allergic rhinitis due to animal (cat) (dog) hair and dander: Secondary | ICD-10-CM | POA: Diagnosis not present

## 2018-12-26 DIAGNOSIS — J301 Allergic rhinitis due to pollen: Secondary | ICD-10-CM | POA: Diagnosis not present

## 2019-01-02 DIAGNOSIS — J3089 Other allergic rhinitis: Secondary | ICD-10-CM | POA: Diagnosis not present

## 2019-01-02 DIAGNOSIS — J3081 Allergic rhinitis due to animal (cat) (dog) hair and dander: Secondary | ICD-10-CM | POA: Diagnosis not present

## 2019-01-02 DIAGNOSIS — J301 Allergic rhinitis due to pollen: Secondary | ICD-10-CM | POA: Diagnosis not present

## 2019-01-10 DIAGNOSIS — J3081 Allergic rhinitis due to animal (cat) (dog) hair and dander: Secondary | ICD-10-CM | POA: Diagnosis not present

## 2019-01-10 DIAGNOSIS — J301 Allergic rhinitis due to pollen: Secondary | ICD-10-CM | POA: Diagnosis not present

## 2019-01-10 DIAGNOSIS — J3089 Other allergic rhinitis: Secondary | ICD-10-CM | POA: Diagnosis not present

## 2019-01-12 DIAGNOSIS — J301 Allergic rhinitis due to pollen: Secondary | ICD-10-CM | POA: Diagnosis not present

## 2019-01-12 DIAGNOSIS — J3081 Allergic rhinitis due to animal (cat) (dog) hair and dander: Secondary | ICD-10-CM | POA: Diagnosis not present

## 2019-01-12 DIAGNOSIS — J3089 Other allergic rhinitis: Secondary | ICD-10-CM | POA: Diagnosis not present

## 2019-01-14 DIAGNOSIS — H6691 Otitis media, unspecified, right ear: Secondary | ICD-10-CM | POA: Diagnosis not present

## 2019-01-17 DIAGNOSIS — J301 Allergic rhinitis due to pollen: Secondary | ICD-10-CM | POA: Diagnosis not present

## 2019-01-17 DIAGNOSIS — J3081 Allergic rhinitis due to animal (cat) (dog) hair and dander: Secondary | ICD-10-CM | POA: Diagnosis not present

## 2019-01-17 DIAGNOSIS — J3089 Other allergic rhinitis: Secondary | ICD-10-CM | POA: Diagnosis not present

## 2019-01-25 DIAGNOSIS — J3081 Allergic rhinitis due to animal (cat) (dog) hair and dander: Secondary | ICD-10-CM | POA: Diagnosis not present

## 2019-01-25 DIAGNOSIS — J3089 Other allergic rhinitis: Secondary | ICD-10-CM | POA: Diagnosis not present

## 2019-01-25 DIAGNOSIS — J301 Allergic rhinitis due to pollen: Secondary | ICD-10-CM | POA: Diagnosis not present

## 2019-02-01 DIAGNOSIS — J3089 Other allergic rhinitis: Secondary | ICD-10-CM | POA: Diagnosis not present

## 2019-02-01 DIAGNOSIS — J3081 Allergic rhinitis due to animal (cat) (dog) hair and dander: Secondary | ICD-10-CM | POA: Diagnosis not present

## 2019-02-01 DIAGNOSIS — J301 Allergic rhinitis due to pollen: Secondary | ICD-10-CM | POA: Diagnosis not present

## 2019-02-02 DIAGNOSIS — J3081 Allergic rhinitis due to animal (cat) (dog) hair and dander: Secondary | ICD-10-CM | POA: Diagnosis not present

## 2019-02-02 DIAGNOSIS — J301 Allergic rhinitis due to pollen: Secondary | ICD-10-CM | POA: Diagnosis not present

## 2019-02-02 DIAGNOSIS — H66003 Acute suppurative otitis media without spontaneous rupture of ear drum, bilateral: Secondary | ICD-10-CM | POA: Diagnosis not present

## 2019-02-02 DIAGNOSIS — J3089 Other allergic rhinitis: Secondary | ICD-10-CM | POA: Diagnosis not present

## 2019-02-07 DIAGNOSIS — J3081 Allergic rhinitis due to animal (cat) (dog) hair and dander: Secondary | ICD-10-CM | POA: Diagnosis not present

## 2019-02-07 DIAGNOSIS — J301 Allergic rhinitis due to pollen: Secondary | ICD-10-CM | POA: Diagnosis not present

## 2019-02-07 DIAGNOSIS — J3089 Other allergic rhinitis: Secondary | ICD-10-CM | POA: Diagnosis not present

## 2019-02-15 DIAGNOSIS — J3081 Allergic rhinitis due to animal (cat) (dog) hair and dander: Secondary | ICD-10-CM | POA: Diagnosis not present

## 2019-02-15 DIAGNOSIS — J3089 Other allergic rhinitis: Secondary | ICD-10-CM | POA: Diagnosis not present

## 2019-02-15 DIAGNOSIS — J301 Allergic rhinitis due to pollen: Secondary | ICD-10-CM | POA: Diagnosis not present

## 2019-02-21 DIAGNOSIS — J3089 Other allergic rhinitis: Secondary | ICD-10-CM | POA: Diagnosis not present

## 2019-02-21 DIAGNOSIS — J3081 Allergic rhinitis due to animal (cat) (dog) hair and dander: Secondary | ICD-10-CM | POA: Diagnosis not present

## 2019-02-21 DIAGNOSIS — J301 Allergic rhinitis due to pollen: Secondary | ICD-10-CM | POA: Diagnosis not present

## 2019-03-01 DIAGNOSIS — J3089 Other allergic rhinitis: Secondary | ICD-10-CM | POA: Diagnosis not present

## 2019-03-01 DIAGNOSIS — J301 Allergic rhinitis due to pollen: Secondary | ICD-10-CM | POA: Diagnosis not present

## 2019-03-01 DIAGNOSIS — J3081 Allergic rhinitis due to animal (cat) (dog) hair and dander: Secondary | ICD-10-CM | POA: Diagnosis not present

## 2019-03-07 DIAGNOSIS — J3081 Allergic rhinitis due to animal (cat) (dog) hair and dander: Secondary | ICD-10-CM | POA: Diagnosis not present

## 2019-03-07 DIAGNOSIS — H66003 Acute suppurative otitis media without spontaneous rupture of ear drum, bilateral: Secondary | ICD-10-CM | POA: Diagnosis not present

## 2019-03-07 DIAGNOSIS — J3089 Other allergic rhinitis: Secondary | ICD-10-CM | POA: Diagnosis not present

## 2019-03-07 DIAGNOSIS — J301 Allergic rhinitis due to pollen: Secondary | ICD-10-CM | POA: Diagnosis not present

## 2019-03-08 DIAGNOSIS — J3081 Allergic rhinitis due to animal (cat) (dog) hair and dander: Secondary | ICD-10-CM | POA: Diagnosis not present

## 2019-03-08 DIAGNOSIS — J3089 Other allergic rhinitis: Secondary | ICD-10-CM | POA: Diagnosis not present

## 2019-03-08 DIAGNOSIS — J301 Allergic rhinitis due to pollen: Secondary | ICD-10-CM | POA: Diagnosis not present

## 2019-03-15 DIAGNOSIS — J3081 Allergic rhinitis due to animal (cat) (dog) hair and dander: Secondary | ICD-10-CM | POA: Diagnosis not present

## 2019-03-15 DIAGNOSIS — J301 Allergic rhinitis due to pollen: Secondary | ICD-10-CM | POA: Diagnosis not present

## 2019-03-15 DIAGNOSIS — J3089 Other allergic rhinitis: Secondary | ICD-10-CM | POA: Diagnosis not present

## 2019-03-21 DIAGNOSIS — J301 Allergic rhinitis due to pollen: Secondary | ICD-10-CM | POA: Diagnosis not present

## 2019-03-21 DIAGNOSIS — J3081 Allergic rhinitis due to animal (cat) (dog) hair and dander: Secondary | ICD-10-CM | POA: Diagnosis not present

## 2019-03-21 DIAGNOSIS — J3089 Other allergic rhinitis: Secondary | ICD-10-CM | POA: Diagnosis not present

## 2019-03-28 DIAGNOSIS — J301 Allergic rhinitis due to pollen: Secondary | ICD-10-CM | POA: Diagnosis not present

## 2019-03-28 DIAGNOSIS — J3089 Other allergic rhinitis: Secondary | ICD-10-CM | POA: Diagnosis not present

## 2019-03-28 DIAGNOSIS — J3081 Allergic rhinitis due to animal (cat) (dog) hair and dander: Secondary | ICD-10-CM | POA: Diagnosis not present

## 2019-03-30 DIAGNOSIS — J301 Allergic rhinitis due to pollen: Secondary | ICD-10-CM | POA: Diagnosis not present

## 2019-03-30 DIAGNOSIS — J3089 Other allergic rhinitis: Secondary | ICD-10-CM | POA: Diagnosis not present

## 2019-03-30 DIAGNOSIS — J3081 Allergic rhinitis due to animal (cat) (dog) hair and dander: Secondary | ICD-10-CM | POA: Diagnosis not present

## 2019-04-04 DIAGNOSIS — H838X2 Other specified diseases of left inner ear: Secondary | ICD-10-CM | POA: Diagnosis not present

## 2019-04-04 DIAGNOSIS — H6983 Other specified disorders of Eustachian tube, bilateral: Secondary | ICD-10-CM | POA: Diagnosis not present

## 2019-04-04 DIAGNOSIS — K1123 Chronic sialoadenitis: Secondary | ICD-10-CM | POA: Diagnosis not present

## 2019-04-04 DIAGNOSIS — H9041 Sensorineural hearing loss, unilateral, right ear, with unrestricted hearing on the contralateral side: Secondary | ICD-10-CM | POA: Diagnosis not present

## 2019-04-05 DIAGNOSIS — J301 Allergic rhinitis due to pollen: Secondary | ICD-10-CM | POA: Diagnosis not present

## 2019-04-05 DIAGNOSIS — J3081 Allergic rhinitis due to animal (cat) (dog) hair and dander: Secondary | ICD-10-CM | POA: Diagnosis not present

## 2019-04-05 DIAGNOSIS — J3089 Other allergic rhinitis: Secondary | ICD-10-CM | POA: Diagnosis not present

## 2019-04-11 DIAGNOSIS — J301 Allergic rhinitis due to pollen: Secondary | ICD-10-CM | POA: Diagnosis not present

## 2019-04-11 DIAGNOSIS — J3081 Allergic rhinitis due to animal (cat) (dog) hair and dander: Secondary | ICD-10-CM | POA: Diagnosis not present

## 2019-04-11 DIAGNOSIS — J3089 Other allergic rhinitis: Secondary | ICD-10-CM | POA: Diagnosis not present

## 2019-04-18 DIAGNOSIS — J3089 Other allergic rhinitis: Secondary | ICD-10-CM | POA: Diagnosis not present

## 2019-04-18 DIAGNOSIS — J3081 Allergic rhinitis due to animal (cat) (dog) hair and dander: Secondary | ICD-10-CM | POA: Diagnosis not present

## 2019-04-18 DIAGNOSIS — J301 Allergic rhinitis due to pollen: Secondary | ICD-10-CM | POA: Diagnosis not present

## 2019-04-23 DIAGNOSIS — H5310 Unspecified subjective visual disturbances: Secondary | ICD-10-CM | POA: Diagnosis not present

## 2019-04-25 DIAGNOSIS — J3081 Allergic rhinitis due to animal (cat) (dog) hair and dander: Secondary | ICD-10-CM | POA: Diagnosis not present

## 2019-04-25 DIAGNOSIS — J301 Allergic rhinitis due to pollen: Secondary | ICD-10-CM | POA: Diagnosis not present

## 2019-04-25 DIAGNOSIS — J3089 Other allergic rhinitis: Secondary | ICD-10-CM | POA: Diagnosis not present

## 2019-05-02 DIAGNOSIS — J301 Allergic rhinitis due to pollen: Secondary | ICD-10-CM | POA: Diagnosis not present

## 2019-05-02 DIAGNOSIS — J3089 Other allergic rhinitis: Secondary | ICD-10-CM | POA: Diagnosis not present

## 2019-05-02 DIAGNOSIS — J3081 Allergic rhinitis due to animal (cat) (dog) hair and dander: Secondary | ICD-10-CM | POA: Diagnosis not present

## 2019-05-09 DIAGNOSIS — J3081 Allergic rhinitis due to animal (cat) (dog) hair and dander: Secondary | ICD-10-CM | POA: Diagnosis not present

## 2019-05-09 DIAGNOSIS — J301 Allergic rhinitis due to pollen: Secondary | ICD-10-CM | POA: Diagnosis not present

## 2019-05-09 DIAGNOSIS — J3089 Other allergic rhinitis: Secondary | ICD-10-CM | POA: Diagnosis not present

## 2019-05-15 DIAGNOSIS — Z23 Encounter for immunization: Secondary | ICD-10-CM | POA: Diagnosis not present

## 2019-05-16 DIAGNOSIS — J3081 Allergic rhinitis due to animal (cat) (dog) hair and dander: Secondary | ICD-10-CM | POA: Diagnosis not present

## 2019-05-16 DIAGNOSIS — J301 Allergic rhinitis due to pollen: Secondary | ICD-10-CM | POA: Diagnosis not present

## 2019-05-16 DIAGNOSIS — J3089 Other allergic rhinitis: Secondary | ICD-10-CM | POA: Diagnosis not present

## 2019-05-22 DIAGNOSIS — R1310 Dysphagia, unspecified: Secondary | ICD-10-CM | POA: Diagnosis not present

## 2019-05-22 DIAGNOSIS — E559 Vitamin D deficiency, unspecified: Secondary | ICD-10-CM | POA: Diagnosis not present

## 2019-05-22 DIAGNOSIS — E782 Mixed hyperlipidemia: Secondary | ICD-10-CM | POA: Diagnosis not present

## 2019-05-22 DIAGNOSIS — M810 Age-related osteoporosis without current pathological fracture: Secondary | ICD-10-CM | POA: Diagnosis not present

## 2019-05-22 DIAGNOSIS — J309 Allergic rhinitis, unspecified: Secondary | ICD-10-CM | POA: Diagnosis not present

## 2019-05-22 DIAGNOSIS — E663 Overweight: Secondary | ICD-10-CM | POA: Diagnosis not present

## 2019-05-22 DIAGNOSIS — Z8719 Personal history of other diseases of the digestive system: Secondary | ICD-10-CM | POA: Diagnosis not present

## 2019-05-23 DIAGNOSIS — J3089 Other allergic rhinitis: Secondary | ICD-10-CM | POA: Diagnosis not present

## 2019-05-23 DIAGNOSIS — J301 Allergic rhinitis due to pollen: Secondary | ICD-10-CM | POA: Diagnosis not present

## 2019-05-23 DIAGNOSIS — J3081 Allergic rhinitis due to animal (cat) (dog) hair and dander: Secondary | ICD-10-CM | POA: Diagnosis not present

## 2019-05-30 DIAGNOSIS — J3081 Allergic rhinitis due to animal (cat) (dog) hair and dander: Secondary | ICD-10-CM | POA: Diagnosis not present

## 2019-05-30 DIAGNOSIS — J3089 Other allergic rhinitis: Secondary | ICD-10-CM | POA: Diagnosis not present

## 2019-05-30 DIAGNOSIS — J301 Allergic rhinitis due to pollen: Secondary | ICD-10-CM | POA: Diagnosis not present

## 2019-06-04 DIAGNOSIS — J309 Allergic rhinitis, unspecified: Secondary | ICD-10-CM | POA: Diagnosis not present

## 2019-06-04 DIAGNOSIS — Z8719 Personal history of other diseases of the digestive system: Secondary | ICD-10-CM | POA: Diagnosis not present

## 2019-06-04 DIAGNOSIS — Z1389 Encounter for screening for other disorder: Secondary | ICD-10-CM | POA: Diagnosis not present

## 2019-06-04 DIAGNOSIS — Z0000 Encounter for general adult medical examination without abnormal findings: Secondary | ICD-10-CM | POA: Diagnosis not present

## 2019-06-04 DIAGNOSIS — E782 Mixed hyperlipidemia: Secondary | ICD-10-CM | POA: Diagnosis not present

## 2019-06-04 DIAGNOSIS — M810 Age-related osteoporosis without current pathological fracture: Secondary | ICD-10-CM | POA: Diagnosis not present

## 2019-06-04 DIAGNOSIS — E559 Vitamin D deficiency, unspecified: Secondary | ICD-10-CM | POA: Diagnosis not present

## 2019-06-04 DIAGNOSIS — Z1211 Encounter for screening for malignant neoplasm of colon: Secondary | ICD-10-CM | POA: Diagnosis not present

## 2019-06-06 DIAGNOSIS — J301 Allergic rhinitis due to pollen: Secondary | ICD-10-CM | POA: Diagnosis not present

## 2019-06-06 DIAGNOSIS — J3089 Other allergic rhinitis: Secondary | ICD-10-CM | POA: Diagnosis not present

## 2019-06-06 DIAGNOSIS — J3081 Allergic rhinitis due to animal (cat) (dog) hair and dander: Secondary | ICD-10-CM | POA: Diagnosis not present

## 2019-06-13 DIAGNOSIS — J3089 Other allergic rhinitis: Secondary | ICD-10-CM | POA: Diagnosis not present

## 2019-06-13 DIAGNOSIS — J3081 Allergic rhinitis due to animal (cat) (dog) hair and dander: Secondary | ICD-10-CM | POA: Diagnosis not present

## 2019-06-13 DIAGNOSIS — J301 Allergic rhinitis due to pollen: Secondary | ICD-10-CM | POA: Diagnosis not present

## 2019-06-20 DIAGNOSIS — J3089 Other allergic rhinitis: Secondary | ICD-10-CM | POA: Diagnosis not present

## 2019-06-20 DIAGNOSIS — J3081 Allergic rhinitis due to animal (cat) (dog) hair and dander: Secondary | ICD-10-CM | POA: Diagnosis not present

## 2019-06-20 DIAGNOSIS — J301 Allergic rhinitis due to pollen: Secondary | ICD-10-CM | POA: Diagnosis not present

## 2019-06-27 DIAGNOSIS — J0100 Acute maxillary sinusitis, unspecified: Secondary | ICD-10-CM | POA: Diagnosis not present

## 2019-06-27 DIAGNOSIS — J3089 Other allergic rhinitis: Secondary | ICD-10-CM | POA: Diagnosis not present

## 2019-06-27 DIAGNOSIS — J0110 Acute frontal sinusitis, unspecified: Secondary | ICD-10-CM | POA: Diagnosis not present

## 2019-06-27 DIAGNOSIS — J301 Allergic rhinitis due to pollen: Secondary | ICD-10-CM | POA: Diagnosis not present

## 2019-06-27 DIAGNOSIS — J3081 Allergic rhinitis due to animal (cat) (dog) hair and dander: Secondary | ICD-10-CM | POA: Diagnosis not present

## 2019-07-04 DIAGNOSIS — J3081 Allergic rhinitis due to animal (cat) (dog) hair and dander: Secondary | ICD-10-CM | POA: Diagnosis not present

## 2019-07-04 DIAGNOSIS — J3089 Other allergic rhinitis: Secondary | ICD-10-CM | POA: Diagnosis not present

## 2019-07-04 DIAGNOSIS — J301 Allergic rhinitis due to pollen: Secondary | ICD-10-CM | POA: Diagnosis not present

## 2019-07-11 DIAGNOSIS — J301 Allergic rhinitis due to pollen: Secondary | ICD-10-CM | POA: Diagnosis not present

## 2019-07-11 DIAGNOSIS — J3089 Other allergic rhinitis: Secondary | ICD-10-CM | POA: Diagnosis not present

## 2019-07-11 DIAGNOSIS — J3081 Allergic rhinitis due to animal (cat) (dog) hair and dander: Secondary | ICD-10-CM | POA: Diagnosis not present

## 2019-07-18 DIAGNOSIS — J3081 Allergic rhinitis due to animal (cat) (dog) hair and dander: Secondary | ICD-10-CM | POA: Diagnosis not present

## 2019-07-18 DIAGNOSIS — J3089 Other allergic rhinitis: Secondary | ICD-10-CM | POA: Diagnosis not present

## 2019-07-18 DIAGNOSIS — J301 Allergic rhinitis due to pollen: Secondary | ICD-10-CM | POA: Diagnosis not present

## 2019-07-25 DIAGNOSIS — J3081 Allergic rhinitis due to animal (cat) (dog) hair and dander: Secondary | ICD-10-CM | POA: Diagnosis not present

## 2019-07-25 DIAGNOSIS — J3089 Other allergic rhinitis: Secondary | ICD-10-CM | POA: Diagnosis not present

## 2019-07-25 DIAGNOSIS — J301 Allergic rhinitis due to pollen: Secondary | ICD-10-CM | POA: Diagnosis not present

## 2019-09-03 DIAGNOSIS — H2513 Age-related nuclear cataract, bilateral: Secondary | ICD-10-CM | POA: Diagnosis not present

## 2019-09-03 DIAGNOSIS — H524 Presbyopia: Secondary | ICD-10-CM | POA: Diagnosis not present

## 2019-09-05 DIAGNOSIS — J301 Allergic rhinitis due to pollen: Secondary | ICD-10-CM | POA: Diagnosis not present

## 2019-09-05 DIAGNOSIS — J3081 Allergic rhinitis due to animal (cat) (dog) hair and dander: Secondary | ICD-10-CM | POA: Diagnosis not present

## 2019-09-05 DIAGNOSIS — J3089 Other allergic rhinitis: Secondary | ICD-10-CM | POA: Diagnosis not present

## 2019-09-12 DIAGNOSIS — J3089 Other allergic rhinitis: Secondary | ICD-10-CM | POA: Diagnosis not present

## 2019-09-12 DIAGNOSIS — J301 Allergic rhinitis due to pollen: Secondary | ICD-10-CM | POA: Diagnosis not present

## 2019-09-12 DIAGNOSIS — J3081 Allergic rhinitis due to animal (cat) (dog) hair and dander: Secondary | ICD-10-CM | POA: Diagnosis not present

## 2019-09-19 ENCOUNTER — Ambulatory Visit: Payer: Medicare Other

## 2019-09-19 DIAGNOSIS — J3089 Other allergic rhinitis: Secondary | ICD-10-CM | POA: Diagnosis not present

## 2019-09-19 DIAGNOSIS — J3081 Allergic rhinitis due to animal (cat) (dog) hair and dander: Secondary | ICD-10-CM | POA: Diagnosis not present

## 2019-09-19 DIAGNOSIS — J301 Allergic rhinitis due to pollen: Secondary | ICD-10-CM | POA: Diagnosis not present

## 2019-09-26 DIAGNOSIS — J301 Allergic rhinitis due to pollen: Secondary | ICD-10-CM | POA: Diagnosis not present

## 2019-09-26 DIAGNOSIS — J3089 Other allergic rhinitis: Secondary | ICD-10-CM | POA: Diagnosis not present

## 2019-09-26 DIAGNOSIS — J3081 Allergic rhinitis due to animal (cat) (dog) hair and dander: Secondary | ICD-10-CM | POA: Diagnosis not present

## 2019-09-28 ENCOUNTER — Ambulatory Visit: Payer: Medicare Other | Attending: Internal Medicine

## 2019-09-28 DIAGNOSIS — Z23 Encounter for immunization: Secondary | ICD-10-CM | POA: Insufficient documentation

## 2019-09-28 NOTE — Progress Notes (Signed)
   Covid-19 Vaccination Clinic  Name:  [name] [name]    MRN: [id number] DOB: 1951/08/21  09/28/2019  [name] was observed post Covid-19 immunization for 15 minutes without incidence. She was provided with Vaccine Information Sheet and instruction to access the V-Safe system.   [name] was instructed to call 911 with any severe reactions post vaccine: Marland Kitchen Difficulty breathing  . Swelling of your face and throat  . A fast heartbeat  . A bad rash all over your body  . Dizziness and weakness    Immunizations Administered    Name Date Dose VIS Date Route   Pfizer COVID-19 Vaccine 09/28/2019  9:05 AM 0.3 mL 08/03/2019 Intramuscular   Manufacturer: [place]   Lot: [id number]   [place]: [id number]

## 2019-10-03 DIAGNOSIS — J301 Allergic rhinitis due to pollen: Secondary | ICD-10-CM | POA: Diagnosis not present

## 2019-10-03 DIAGNOSIS — J3081 Allergic rhinitis due to animal (cat) (dog) hair and dander: Secondary | ICD-10-CM | POA: Diagnosis not present

## 2019-10-03 DIAGNOSIS — J3089 Other allergic rhinitis: Secondary | ICD-10-CM | POA: Diagnosis not present

## 2019-10-10 ENCOUNTER — Ambulatory Visit: Payer: Medicare Other

## 2019-10-10 DIAGNOSIS — J3089 Other allergic rhinitis: Secondary | ICD-10-CM | POA: Diagnosis not present

## 2019-10-10 DIAGNOSIS — J301 Allergic rhinitis due to pollen: Secondary | ICD-10-CM | POA: Diagnosis not present

## 2019-10-10 DIAGNOSIS — J3081 Allergic rhinitis due to animal (cat) (dog) hair and dander: Secondary | ICD-10-CM | POA: Diagnosis not present

## 2019-10-17 DIAGNOSIS — J3081 Allergic rhinitis due to animal (cat) (dog) hair and dander: Secondary | ICD-10-CM | POA: Diagnosis not present

## 2019-10-17 DIAGNOSIS — J301 Allergic rhinitis due to pollen: Secondary | ICD-10-CM | POA: Diagnosis not present

## 2019-10-17 DIAGNOSIS — J3089 Other allergic rhinitis: Secondary | ICD-10-CM | POA: Diagnosis not present

## 2019-10-24 ENCOUNTER — Ambulatory Visit: Payer: Medicare Other | Attending: Internal Medicine

## 2019-10-24 DIAGNOSIS — Z23 Encounter for immunization: Secondary | ICD-10-CM | POA: Insufficient documentation

## 2019-10-24 NOTE — Progress Notes (Signed)
   Covid-19 Vaccination Clinic  Name:  [name] [name]    MRN: [id number] DOB: 04-30-1951  10/24/2019  [name] [name] was observed post Covid-19 immunization for 15 minutes without incident. She was provided with Vaccine Information Sheet and instruction to access the V-Safe system.   [name] [name] was instructed to call 911 with any severe reactions post vaccine: Marland Kitchen Difficulty breathing  . Swelling of face and throat  . A fast heartbeat  . A bad rash all over body  . Dizziness and weakness   Immunizations Administered    Name Date Dose VIS Date Route   Pfizer COVID-19 Vaccine 10/24/2019  9:49 AM 0.3 mL 08/03/2019 Intramuscular   Manufacturer: [place]   Lot: [id number]   [place]: [id number]

## 2019-10-31 DIAGNOSIS — J3089 Other allergic rhinitis: Secondary | ICD-10-CM | POA: Diagnosis not present

## 2019-10-31 DIAGNOSIS — J3081 Allergic rhinitis due to animal (cat) (dog) hair and dander: Secondary | ICD-10-CM | POA: Diagnosis not present

## 2019-10-31 DIAGNOSIS — J301 Allergic rhinitis due to pollen: Secondary | ICD-10-CM | POA: Diagnosis not present

## 2019-11-02 DIAGNOSIS — J301 Allergic rhinitis due to pollen: Secondary | ICD-10-CM | POA: Diagnosis not present

## 2019-11-02 DIAGNOSIS — J3081 Allergic rhinitis due to animal (cat) (dog) hair and dander: Secondary | ICD-10-CM | POA: Diagnosis not present

## 2019-11-07 DIAGNOSIS — J3081 Allergic rhinitis due to animal (cat) (dog) hair and dander: Secondary | ICD-10-CM | POA: Diagnosis not present

## 2019-11-07 DIAGNOSIS — J3089 Other allergic rhinitis: Secondary | ICD-10-CM | POA: Diagnosis not present

## 2019-11-07 DIAGNOSIS — J301 Allergic rhinitis due to pollen: Secondary | ICD-10-CM | POA: Diagnosis not present

## 2019-11-12 DIAGNOSIS — J3089 Other allergic rhinitis: Secondary | ICD-10-CM | POA: Diagnosis not present

## 2019-11-12 DIAGNOSIS — J301 Allergic rhinitis due to pollen: Secondary | ICD-10-CM | POA: Diagnosis not present

## 2019-11-12 DIAGNOSIS — J3081 Allergic rhinitis due to animal (cat) (dog) hair and dander: Secondary | ICD-10-CM | POA: Diagnosis not present

## 2019-11-16 DIAGNOSIS — J3089 Other allergic rhinitis: Secondary | ICD-10-CM | POA: Diagnosis not present

## 2019-11-22 DIAGNOSIS — J3089 Other allergic rhinitis: Secondary | ICD-10-CM | POA: Diagnosis not present

## 2019-11-22 DIAGNOSIS — J3081 Allergic rhinitis due to animal (cat) (dog) hair and dander: Secondary | ICD-10-CM | POA: Diagnosis not present

## 2019-11-22 DIAGNOSIS — J301 Allergic rhinitis due to pollen: Secondary | ICD-10-CM | POA: Diagnosis not present

## 2019-11-28 DIAGNOSIS — J3081 Allergic rhinitis due to animal (cat) (dog) hair and dander: Secondary | ICD-10-CM | POA: Diagnosis not present

## 2019-11-28 DIAGNOSIS — J301 Allergic rhinitis due to pollen: Secondary | ICD-10-CM | POA: Diagnosis not present

## 2019-11-28 DIAGNOSIS — J3089 Other allergic rhinitis: Secondary | ICD-10-CM | POA: Diagnosis not present

## 2019-12-05 DIAGNOSIS — J3089 Other allergic rhinitis: Secondary | ICD-10-CM | POA: Diagnosis not present

## 2019-12-05 DIAGNOSIS — J301 Allergic rhinitis due to pollen: Secondary | ICD-10-CM | POA: Diagnosis not present

## 2019-12-05 DIAGNOSIS — J3081 Allergic rhinitis due to animal (cat) (dog) hair and dander: Secondary | ICD-10-CM | POA: Diagnosis not present

## 2019-12-12 DIAGNOSIS — J301 Allergic rhinitis due to pollen: Secondary | ICD-10-CM | POA: Diagnosis not present

## 2019-12-12 DIAGNOSIS — J3089 Other allergic rhinitis: Secondary | ICD-10-CM | POA: Diagnosis not present

## 2019-12-12 DIAGNOSIS — J3081 Allergic rhinitis due to animal (cat) (dog) hair and dander: Secondary | ICD-10-CM | POA: Diagnosis not present

## 2019-12-19 DIAGNOSIS — J3089 Other allergic rhinitis: Secondary | ICD-10-CM | POA: Diagnosis not present

## 2019-12-19 DIAGNOSIS — J301 Allergic rhinitis due to pollen: Secondary | ICD-10-CM | POA: Diagnosis not present

## 2019-12-19 DIAGNOSIS — J3081 Allergic rhinitis due to animal (cat) (dog) hair and dander: Secondary | ICD-10-CM | POA: Diagnosis not present

## 2019-12-21 DIAGNOSIS — J329 Chronic sinusitis, unspecified: Secondary | ICD-10-CM | POA: Diagnosis not present

## 2019-12-21 DIAGNOSIS — H6983 Other specified disorders of Eustachian tube, bilateral: Secondary | ICD-10-CM | POA: Diagnosis not present

## 2019-12-26 DIAGNOSIS — J3081 Allergic rhinitis due to animal (cat) (dog) hair and dander: Secondary | ICD-10-CM | POA: Diagnosis not present

## 2019-12-26 DIAGNOSIS — J3089 Other allergic rhinitis: Secondary | ICD-10-CM | POA: Diagnosis not present

## 2019-12-26 DIAGNOSIS — J301 Allergic rhinitis due to pollen: Secondary | ICD-10-CM | POA: Diagnosis not present

## 2020-01-02 DIAGNOSIS — J3081 Allergic rhinitis due to animal (cat) (dog) hair and dander: Secondary | ICD-10-CM | POA: Diagnosis not present

## 2020-01-02 DIAGNOSIS — J301 Allergic rhinitis due to pollen: Secondary | ICD-10-CM | POA: Diagnosis not present

## 2020-01-02 DIAGNOSIS — J3089 Other allergic rhinitis: Secondary | ICD-10-CM | POA: Diagnosis not present

## 2020-01-03 IMAGING — RF DG ESOPHAGUS
8 of 9 series · 14 of 24 positions shown · non-contrast
Comparison: None.

CLINICAL DATA: Dysphagia

EXAM:
ESOPHOGRAM / BARIUM SWALLOW / BARIUM TABLET STUDY
TECHNIQUE: Combined double contrast and single contrast examination performed
using effervescent crystals, thick barium liquid, and thin barium
liquid. The patient was observed with fluoroscopy swallowing a 13 mm
barium sulphate tablet.
FLUOROSCOPY TIME:  Fluoroscopy Time:  1 minutes 30 seconds
Radiation Exposure Index (if provided by the fluoroscopic device):
109 mGy
Number of Acquired Spot Images: 0

[Series 1: sequence · 2 of 10 frames shown (1 of 7)]
[frame 2/10]
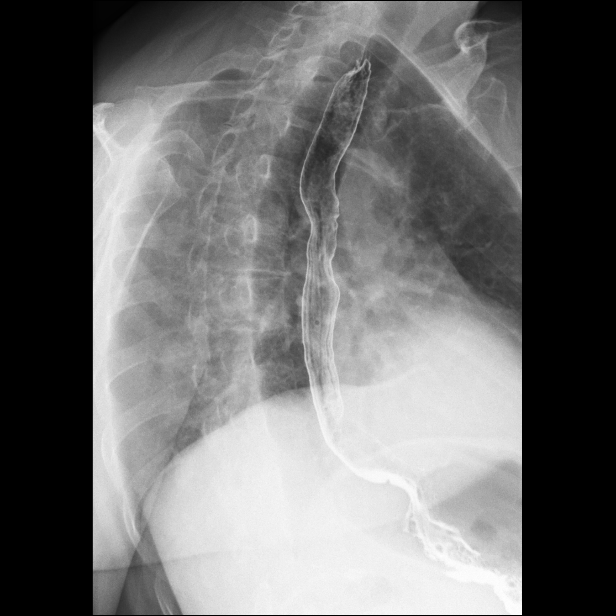
[frame 7/10]
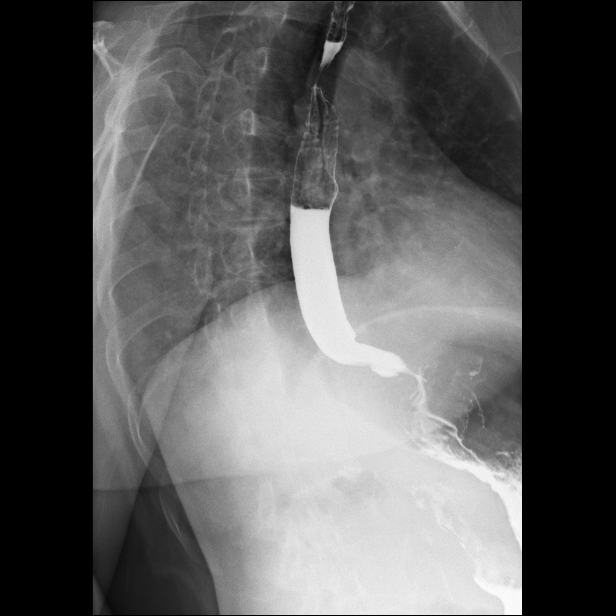

[Series 2: sequence · 2 of 31 frames shown (2 of 7)]
[frame 11/31]
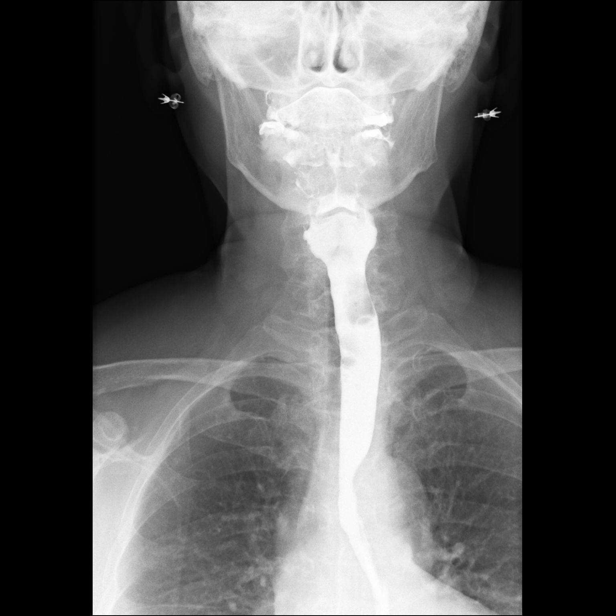
[frame 27/31]
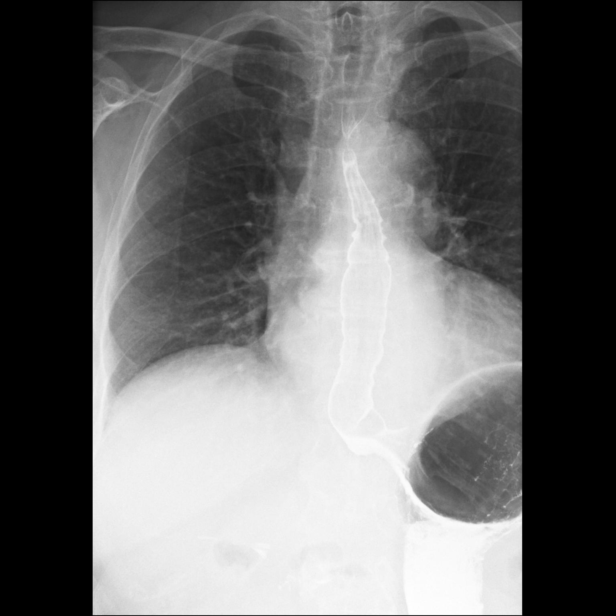

[Series 3: sequence · 2 of 12 frames shown (3 of 7)]
[frame 2/12]
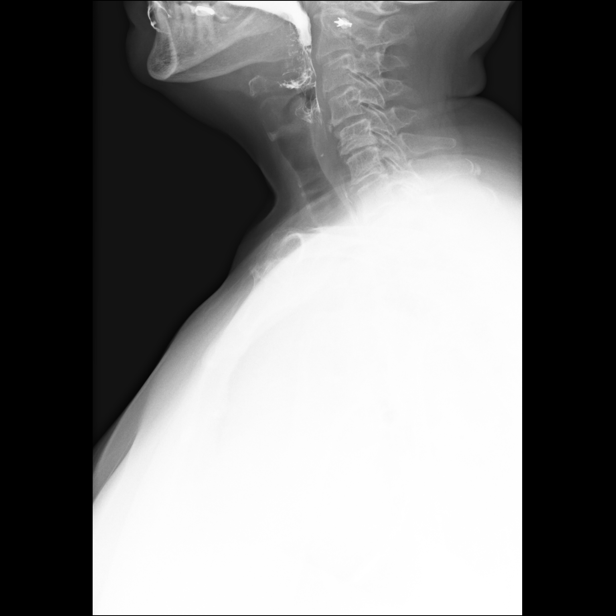
[frame 12/12]
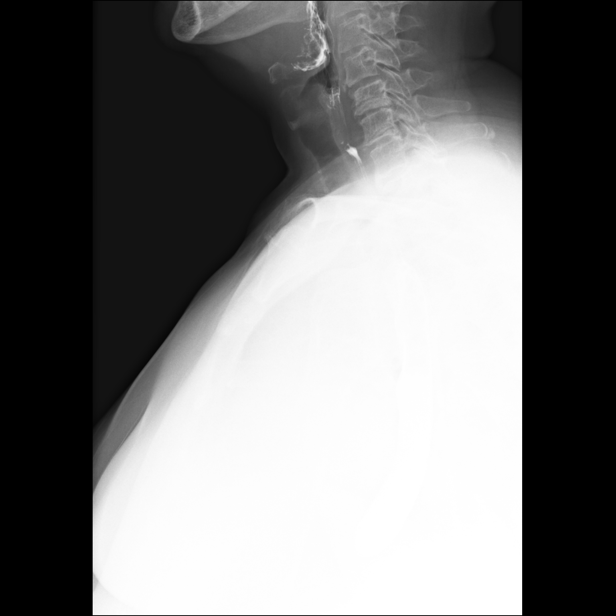

[Series 4: sequence · 2 of 20 frames shown (4 of 7)]
[frame 11/20]
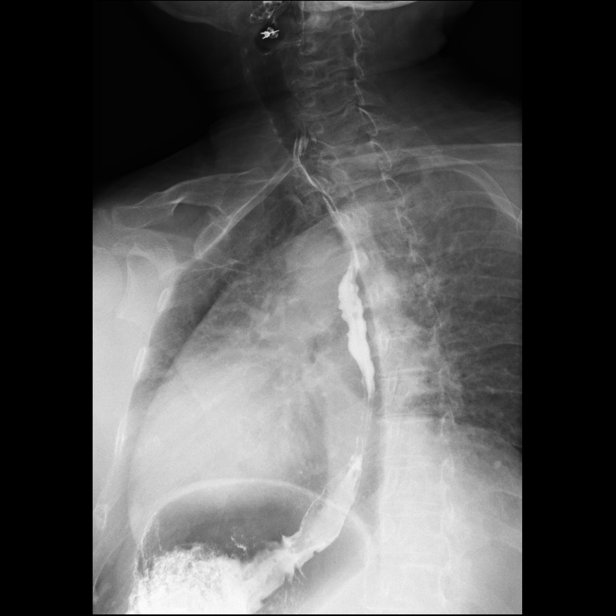
[frame 16/20]
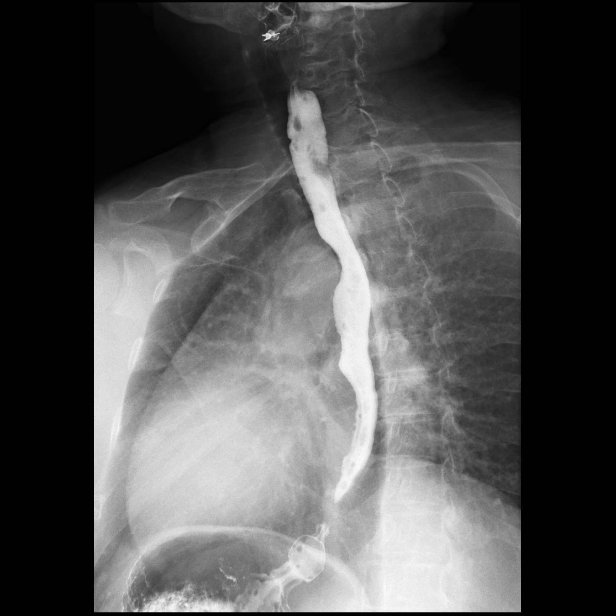

[Series 5: sequence · 2 of 17 frames shown (5 of 7)]
[frame 3/17]
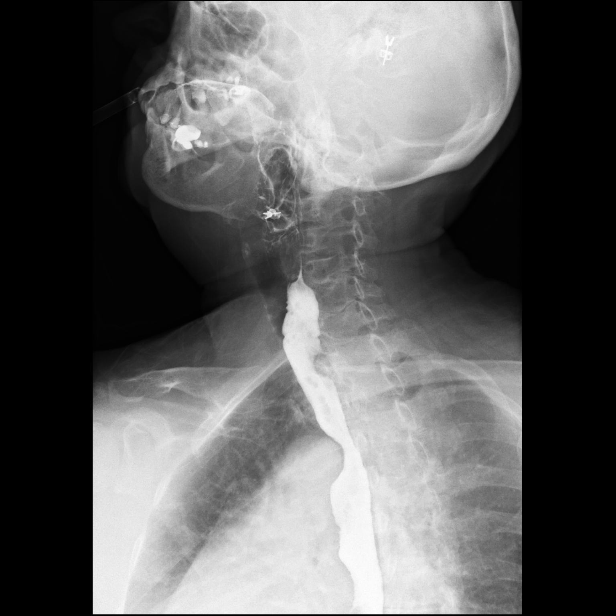
[frame 15/17]
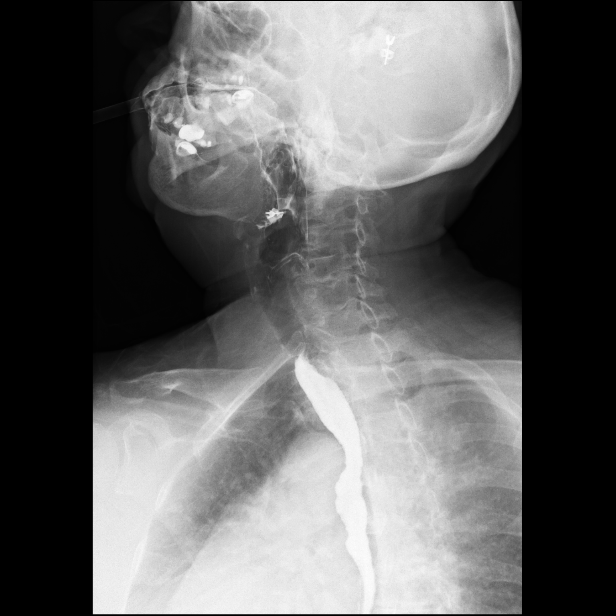

[Series 6: sequence · 2 of 91 frames shown (6 of 7)]
[frame 14/91]
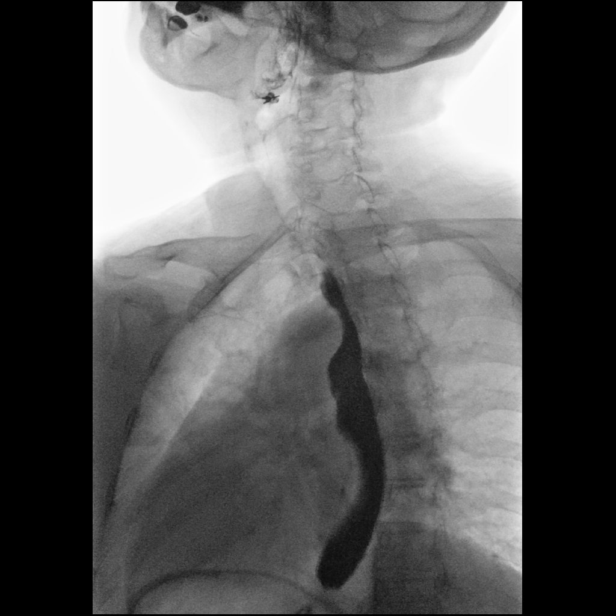
[frame 46/91]
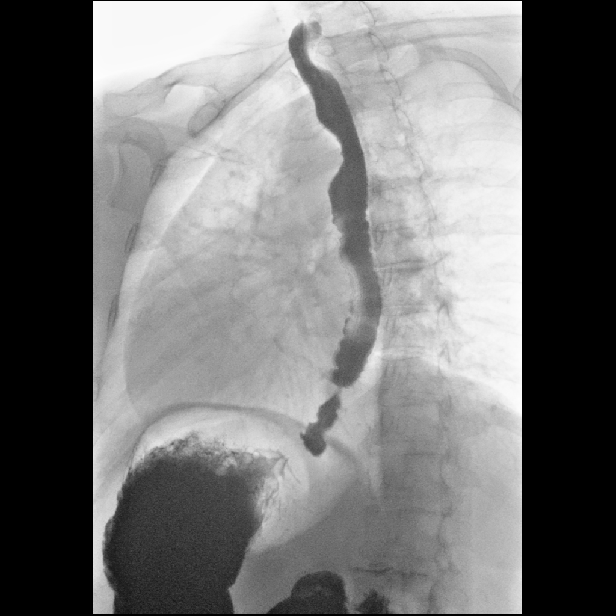

[Series 8: sequence · 1 of 1 slices shown (7 of 7)]
[im 1/1]
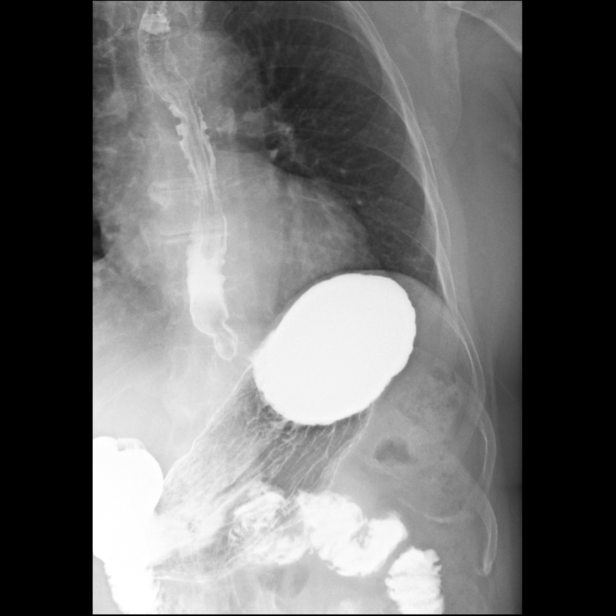

[Series 10: one shot · 1 of 1 slices shown]
[im 1/1]
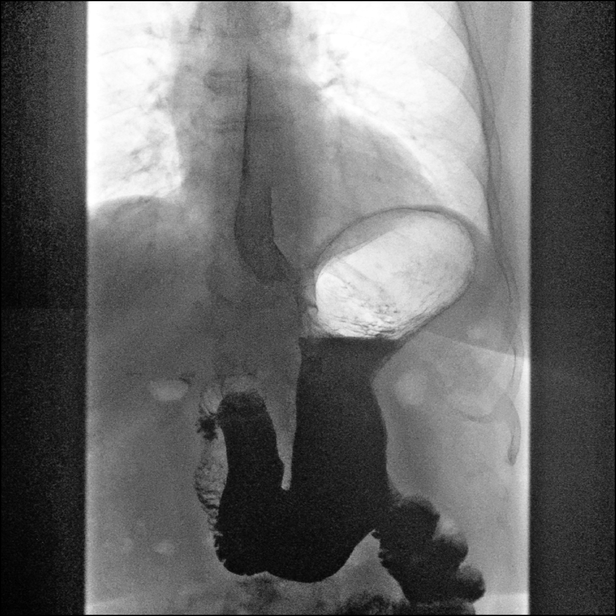

[14 of 24 positions shown; findings below may reference images not displayed]

FINDINGS: Initially double-contrast study was performed. The mucosa of the
esophagus is unremarkable. Rapid sequence spot films of the cervical
esophagus were performed with single contrast barium. The swallowing
mechanism is unremarkable. There is a prominent cricopharyngeus
muscle noted. There is some indentation upon the posterior aspect of
the upper thoracic esophagus which may be due to a vessel. I did
review of CT chest from 6966 and no ectopic vessel is seen.
Therefore, a 70 codes the esophageal lesion in the upper thoracic
esophagus would be a definite consideration and endoscopy is
recommended. Moderate tertiary contractions are present in the mid
and distal esophagus. There is a small sliding hiatal hernia
present. Significant spontaneous gastroesophageal reflux is
demonstrated. A barium pill was given at the end of the study which
passed into the stomach without delay.
IMPRESSION: 1. Possible submucosal upper thoracic esophageal lesion. Recommend
endoscopy. No vessel is seen at this site on prior CT of the chest
to explain this indentation.
2. Significant spontaneous gastroesophageal reflux. Barium pill
passes into the stomach without delay.
3. Small sliding hiatal hernia.
4. Prominent cricopharyngeus muscle.
5. Moderate tertiary contractions.

## 2020-01-04 DIAGNOSIS — Z01419 Encounter for gynecological examination (general) (routine) without abnormal findings: Secondary | ICD-10-CM | POA: Diagnosis not present

## 2020-01-04 DIAGNOSIS — Z1231 Encounter for screening mammogram for malignant neoplasm of breast: Secondary | ICD-10-CM | POA: Diagnosis not present

## 2020-01-04 DIAGNOSIS — Z124 Encounter for screening for malignant neoplasm of cervix: Secondary | ICD-10-CM | POA: Diagnosis not present

## 2020-01-09 DIAGNOSIS — J3089 Other allergic rhinitis: Secondary | ICD-10-CM | POA: Diagnosis not present

## 2020-01-09 DIAGNOSIS — J301 Allergic rhinitis due to pollen: Secondary | ICD-10-CM | POA: Diagnosis not present

## 2020-01-09 DIAGNOSIS — J3081 Allergic rhinitis due to animal (cat) (dog) hair and dander: Secondary | ICD-10-CM | POA: Diagnosis not present

## 2020-01-16 DIAGNOSIS — J301 Allergic rhinitis due to pollen: Secondary | ICD-10-CM | POA: Diagnosis not present

## 2020-01-16 DIAGNOSIS — J3089 Other allergic rhinitis: Secondary | ICD-10-CM | POA: Diagnosis not present

## 2020-01-16 DIAGNOSIS — J3081 Allergic rhinitis due to animal (cat) (dog) hair and dander: Secondary | ICD-10-CM | POA: Diagnosis not present

## 2020-01-23 DIAGNOSIS — J3089 Other allergic rhinitis: Secondary | ICD-10-CM | POA: Diagnosis not present

## 2020-01-23 DIAGNOSIS — J3081 Allergic rhinitis due to animal (cat) (dog) hair and dander: Secondary | ICD-10-CM | POA: Diagnosis not present

## 2020-01-23 DIAGNOSIS — J301 Allergic rhinitis due to pollen: Secondary | ICD-10-CM | POA: Diagnosis not present

## 2020-01-30 DIAGNOSIS — J3089 Other allergic rhinitis: Secondary | ICD-10-CM | POA: Diagnosis not present

## 2020-01-30 DIAGNOSIS — J301 Allergic rhinitis due to pollen: Secondary | ICD-10-CM | POA: Diagnosis not present

## 2020-03-05 DIAGNOSIS — J3089 Other allergic rhinitis: Secondary | ICD-10-CM | POA: Diagnosis not present

## 2020-03-05 DIAGNOSIS — J301 Allergic rhinitis due to pollen: Secondary | ICD-10-CM | POA: Diagnosis not present

## 2020-03-05 DIAGNOSIS — J3081 Allergic rhinitis due to animal (cat) (dog) hair and dander: Secondary | ICD-10-CM | POA: Diagnosis not present

## 2020-03-12 DIAGNOSIS — J3081 Allergic rhinitis due to animal (cat) (dog) hair and dander: Secondary | ICD-10-CM | POA: Diagnosis not present

## 2020-03-12 DIAGNOSIS — J3089 Other allergic rhinitis: Secondary | ICD-10-CM | POA: Diagnosis not present

## 2020-03-12 DIAGNOSIS — J301 Allergic rhinitis due to pollen: Secondary | ICD-10-CM | POA: Diagnosis not present

## 2020-03-19 DIAGNOSIS — J301 Allergic rhinitis due to pollen: Secondary | ICD-10-CM | POA: Diagnosis not present

## 2020-03-19 DIAGNOSIS — J3089 Other allergic rhinitis: Secondary | ICD-10-CM | POA: Diagnosis not present

## 2020-03-19 DIAGNOSIS — J3081 Allergic rhinitis due to animal (cat) (dog) hair and dander: Secondary | ICD-10-CM | POA: Diagnosis not present

## 2020-03-31 DIAGNOSIS — D123 Benign neoplasm of transverse colon: Secondary | ICD-10-CM | POA: Diagnosis not present

## 2020-03-31 DIAGNOSIS — K5730 Diverticulosis of large intestine without perforation or abscess without bleeding: Secondary | ICD-10-CM | POA: Diagnosis not present

## 2020-03-31 DIAGNOSIS — Z1211 Encounter for screening for malignant neoplasm of colon: Secondary | ICD-10-CM | POA: Diagnosis not present

## 2020-03-31 DIAGNOSIS — D122 Benign neoplasm of ascending colon: Secondary | ICD-10-CM | POA: Diagnosis not present

## 2020-04-02 DIAGNOSIS — D123 Benign neoplasm of transverse colon: Secondary | ICD-10-CM | POA: Diagnosis not present

## 2020-04-02 DIAGNOSIS — D122 Benign neoplasm of ascending colon: Secondary | ICD-10-CM | POA: Diagnosis not present

## 2020-05-23 DIAGNOSIS — Z23 Encounter for immunization: Secondary | ICD-10-CM | POA: Diagnosis not present

## 2020-06-11 DIAGNOSIS — Z23 Encounter for immunization: Secondary | ICD-10-CM | POA: Diagnosis not present

## 2020-07-05 ENCOUNTER — Ambulatory Visit: Payer: Medicare Other

## 2020-07-25 DIAGNOSIS — E559 Vitamin D deficiency, unspecified: Secondary | ICD-10-CM | POA: Diagnosis not present

## 2020-07-25 DIAGNOSIS — Z0000 Encounter for general adult medical examination without abnormal findings: Secondary | ICD-10-CM | POA: Diagnosis not present

## 2020-07-25 DIAGNOSIS — M810 Age-related osteoporosis without current pathological fracture: Secondary | ICD-10-CM | POA: Diagnosis not present

## 2020-07-25 DIAGNOSIS — J309 Allergic rhinitis, unspecified: Secondary | ICD-10-CM | POA: Diagnosis not present

## 2020-07-25 DIAGNOSIS — Z8719 Personal history of other diseases of the digestive system: Secondary | ICD-10-CM | POA: Diagnosis not present

## 2020-07-25 DIAGNOSIS — E782 Mixed hyperlipidemia: Secondary | ICD-10-CM | POA: Diagnosis not present

## 2020-07-25 DIAGNOSIS — Z1211 Encounter for screening for malignant neoplasm of colon: Secondary | ICD-10-CM | POA: Diagnosis not present

## 2020-09-02 DIAGNOSIS — H2513 Age-related nuclear cataract, bilateral: Secondary | ICD-10-CM | POA: Diagnosis not present

## 2020-09-02 DIAGNOSIS — H5201 Hypermetropia, right eye: Secondary | ICD-10-CM | POA: Diagnosis not present

## 2020-09-02 DIAGNOSIS — H5212 Myopia, left eye: Secondary | ICD-10-CM | POA: Diagnosis not present

## 2020-09-05 DIAGNOSIS — J309 Allergic rhinitis, unspecified: Secondary | ICD-10-CM | POA: Diagnosis not present

## 2020-09-05 DIAGNOSIS — Z1389 Encounter for screening for other disorder: Secondary | ICD-10-CM | POA: Diagnosis not present

## 2020-09-05 DIAGNOSIS — Z0000 Encounter for general adult medical examination without abnormal findings: Secondary | ICD-10-CM | POA: Diagnosis not present

## 2020-09-05 DIAGNOSIS — Z8719 Personal history of other diseases of the digestive system: Secondary | ICD-10-CM | POA: Diagnosis not present

## 2020-09-05 DIAGNOSIS — E559 Vitamin D deficiency, unspecified: Secondary | ICD-10-CM | POA: Diagnosis not present

## 2020-09-05 DIAGNOSIS — M810 Age-related osteoporosis without current pathological fracture: Secondary | ICD-10-CM | POA: Diagnosis not present

## 2020-09-05 DIAGNOSIS — E663 Overweight: Secondary | ICD-10-CM | POA: Diagnosis not present

## 2020-09-05 DIAGNOSIS — E782 Mixed hyperlipidemia: Secondary | ICD-10-CM | POA: Diagnosis not present

## 2020-11-18 DIAGNOSIS — M8589 Other specified disorders of bone density and structure, multiple sites: Secondary | ICD-10-CM | POA: Diagnosis not present

## 2020-11-18 DIAGNOSIS — M810 Age-related osteoporosis without current pathological fracture: Secondary | ICD-10-CM | POA: Diagnosis not present

## 2020-12-23 DIAGNOSIS — Z23 Encounter for immunization: Secondary | ICD-10-CM | POA: Diagnosis not present

## 2021-01-12 DIAGNOSIS — Z1231 Encounter for screening mammogram for malignant neoplasm of breast: Secondary | ICD-10-CM | POA: Diagnosis not present

## 2021-03-06 DIAGNOSIS — E782 Mixed hyperlipidemia: Secondary | ICD-10-CM | POA: Diagnosis not present

## 2021-03-06 DIAGNOSIS — Z8719 Personal history of other diseases of the digestive system: Secondary | ICD-10-CM | POA: Diagnosis not present

## 2021-03-06 DIAGNOSIS — M810 Age-related osteoporosis without current pathological fracture: Secondary | ICD-10-CM | POA: Diagnosis not present

## 2021-03-06 DIAGNOSIS — E663 Overweight: Secondary | ICD-10-CM | POA: Diagnosis not present

## 2021-03-06 DIAGNOSIS — J309 Allergic rhinitis, unspecified: Secondary | ICD-10-CM | POA: Diagnosis not present

## 2021-03-06 DIAGNOSIS — Z1389 Encounter for screening for other disorder: Secondary | ICD-10-CM | POA: Diagnosis not present

## 2021-03-06 DIAGNOSIS — Z0000 Encounter for general adult medical examination without abnormal findings: Secondary | ICD-10-CM | POA: Diagnosis not present

## 2021-03-06 DIAGNOSIS — E559 Vitamin D deficiency, unspecified: Secondary | ICD-10-CM | POA: Diagnosis not present

## 2021-05-26 DIAGNOSIS — E782 Mixed hyperlipidemia: Secondary | ICD-10-CM | POA: Diagnosis not present

## 2021-06-05 DIAGNOSIS — Z23 Encounter for immunization: Secondary | ICD-10-CM | POA: Diagnosis not present

## 2021-08-26 DIAGNOSIS — Z23 Encounter for immunization: Secondary | ICD-10-CM | POA: Diagnosis not present

## 2021-09-11 DIAGNOSIS — E559 Vitamin D deficiency, unspecified: Secondary | ICD-10-CM | POA: Diagnosis not present

## 2021-09-11 DIAGNOSIS — E782 Mixed hyperlipidemia: Secondary | ICD-10-CM | POA: Diagnosis not present

## 2021-09-11 DIAGNOSIS — Z1389 Encounter for screening for other disorder: Secondary | ICD-10-CM | POA: Diagnosis not present

## 2021-09-11 DIAGNOSIS — Z0000 Encounter for general adult medical examination without abnormal findings: Secondary | ICD-10-CM | POA: Diagnosis not present

## 2021-09-18 DIAGNOSIS — Z636 Dependent relative needing care at home: Secondary | ICD-10-CM | POA: Diagnosis not present

## 2021-09-18 DIAGNOSIS — Z8719 Personal history of other diseases of the digestive system: Secondary | ICD-10-CM | POA: Diagnosis not present

## 2021-09-18 DIAGNOSIS — M810 Age-related osteoporosis without current pathological fracture: Secondary | ICD-10-CM | POA: Diagnosis not present

## 2021-09-18 DIAGNOSIS — J309 Allergic rhinitis, unspecified: Secondary | ICD-10-CM | POA: Diagnosis not present

## 2021-09-18 DIAGNOSIS — E782 Mixed hyperlipidemia: Secondary | ICD-10-CM | POA: Diagnosis not present

## 2021-11-19 DIAGNOSIS — J0181 Other acute recurrent sinusitis: Secondary | ICD-10-CM | POA: Diagnosis not present

## 2021-12-25 DIAGNOSIS — Z03818 Encounter for observation for suspected exposure to other biological agents ruled out: Secondary | ICD-10-CM | POA: Diagnosis not present

## 2021-12-25 DIAGNOSIS — H6983 Other specified disorders of Eustachian tube, bilateral: Secondary | ICD-10-CM | POA: Diagnosis not present

## 2021-12-25 DIAGNOSIS — R059 Cough, unspecified: Secondary | ICD-10-CM | POA: Diagnosis not present

## 2021-12-25 DIAGNOSIS — R0981 Nasal congestion: Secondary | ICD-10-CM | POA: Diagnosis not present

## 2022-01-05 DIAGNOSIS — H524 Presbyopia: Secondary | ICD-10-CM | POA: Diagnosis not present

## 2022-01-05 DIAGNOSIS — H2513 Age-related nuclear cataract, bilateral: Secondary | ICD-10-CM | POA: Diagnosis not present

## 2022-01-14 DIAGNOSIS — Z1231 Encounter for screening mammogram for malignant neoplasm of breast: Secondary | ICD-10-CM | POA: Diagnosis not present

## 2022-02-17 DIAGNOSIS — M7661 Achilles tendinitis, right leg: Secondary | ICD-10-CM | POA: Diagnosis not present

## 2022-02-25 DIAGNOSIS — L570 Actinic keratosis: Secondary | ICD-10-CM | POA: Diagnosis not present

## 2022-02-25 DIAGNOSIS — L578 Other skin changes due to chronic exposure to nonionizing radiation: Secondary | ICD-10-CM | POA: Diagnosis not present

## 2022-02-25 DIAGNOSIS — Z85828 Personal history of other malignant neoplasm of skin: Secondary | ICD-10-CM | POA: Diagnosis not present

## 2022-02-25 DIAGNOSIS — L821 Other seborrheic keratosis: Secondary | ICD-10-CM | POA: Diagnosis not present

## 2022-04-16 DIAGNOSIS — R030 Elevated blood-pressure reading, without diagnosis of hypertension: Secondary | ICD-10-CM | POA: Diagnosis not present

## 2022-04-16 DIAGNOSIS — M722 Plantar fascial fibromatosis: Secondary | ICD-10-CM | POA: Diagnosis not present

## 2022-04-16 DIAGNOSIS — E7800 Pure hypercholesterolemia, unspecified: Secondary | ICD-10-CM | POA: Diagnosis not present

## 2022-04-16 DIAGNOSIS — M810 Age-related osteoporosis without current pathological fracture: Secondary | ICD-10-CM | POA: Diagnosis not present

## 2022-05-05 DIAGNOSIS — M25571 Pain in right ankle and joints of right foot: Secondary | ICD-10-CM | POA: Diagnosis not present

## 2022-05-05 DIAGNOSIS — M79671 Pain in right foot: Secondary | ICD-10-CM | POA: Diagnosis not present

## 2022-05-05 DIAGNOSIS — M7671 Peroneal tendinitis, right leg: Secondary | ICD-10-CM | POA: Diagnosis not present

## 2022-05-18 DIAGNOSIS — M25571 Pain in right ankle and joints of right foot: Secondary | ICD-10-CM | POA: Diagnosis not present

## 2022-05-23 DIAGNOSIS — Z23 Encounter for immunization: Secondary | ICD-10-CM | POA: Diagnosis not present

## 2022-06-16 DIAGNOSIS — M7671 Peroneal tendinitis, right leg: Secondary | ICD-10-CM | POA: Diagnosis not present

## 2022-06-17 DIAGNOSIS — Z79899 Other long term (current) drug therapy: Secondary | ICD-10-CM | POA: Diagnosis not present

## 2022-06-17 DIAGNOSIS — Z23 Encounter for immunization: Secondary | ICD-10-CM | POA: Diagnosis not present

## 2022-06-17 DIAGNOSIS — E7800 Pure hypercholesterolemia, unspecified: Secondary | ICD-10-CM | POA: Diagnosis not present

## 2022-06-17 DIAGNOSIS — R030 Elevated blood-pressure reading, without diagnosis of hypertension: Secondary | ICD-10-CM | POA: Diagnosis not present

## 2022-08-27 DIAGNOSIS — M7671 Peroneal tendinitis, right leg: Secondary | ICD-10-CM | POA: Diagnosis not present

## 2022-08-27 DIAGNOSIS — M67879 Other specified disorders of synovium and tendon, unspecified ankle and foot: Secondary | ICD-10-CM | POA: Diagnosis not present

## 2022-09-02 DIAGNOSIS — M25571 Pain in right ankle and joints of right foot: Secondary | ICD-10-CM | POA: Diagnosis not present

## 2022-09-07 DIAGNOSIS — M25571 Pain in right ankle and joints of right foot: Secondary | ICD-10-CM | POA: Diagnosis not present

## 2022-09-09 DIAGNOSIS — H6692 Otitis media, unspecified, left ear: Secondary | ICD-10-CM | POA: Diagnosis not present

## 2022-09-09 DIAGNOSIS — H6122 Impacted cerumen, left ear: Secondary | ICD-10-CM | POA: Diagnosis not present

## 2022-09-09 DIAGNOSIS — J309 Allergic rhinitis, unspecified: Secondary | ICD-10-CM | POA: Diagnosis not present

## 2022-10-05 DIAGNOSIS — E782 Mixed hyperlipidemia: Secondary | ICD-10-CM | POA: Diagnosis not present

## 2022-10-05 DIAGNOSIS — Z0000 Encounter for general adult medical examination without abnormal findings: Secondary | ICD-10-CM | POA: Diagnosis not present

## 2022-10-05 DIAGNOSIS — J309 Allergic rhinitis, unspecified: Secondary | ICD-10-CM | POA: Diagnosis not present

## 2022-10-05 DIAGNOSIS — M810 Age-related osteoporosis without current pathological fracture: Secondary | ICD-10-CM | POA: Diagnosis not present

## 2022-10-08 ENCOUNTER — Other Ambulatory Visit: Payer: Self-pay | Admitting: Family Medicine

## 2022-10-08 DIAGNOSIS — M810 Age-related osteoporosis without current pathological fracture: Secondary | ICD-10-CM

## 2022-10-12 DIAGNOSIS — H903 Sensorineural hearing loss, bilateral: Secondary | ICD-10-CM | POA: Diagnosis not present

## 2022-10-12 DIAGNOSIS — H6981 Other specified disorders of Eustachian tube, right ear: Secondary | ICD-10-CM | POA: Diagnosis not present

## 2022-10-12 DIAGNOSIS — J343 Hypertrophy of nasal turbinates: Secondary | ICD-10-CM | POA: Diagnosis not present

## 2022-10-12 DIAGNOSIS — J310 Chronic rhinitis: Secondary | ICD-10-CM | POA: Diagnosis not present

## 2022-10-12 DIAGNOSIS — H838X3 Other specified diseases of inner ear, bilateral: Secondary | ICD-10-CM | POA: Diagnosis not present

## 2022-10-12 DIAGNOSIS — J342 Deviated nasal septum: Secondary | ICD-10-CM | POA: Diagnosis not present

## 2022-10-13 DIAGNOSIS — M67871 Other specified disorders of synovium, right ankle and foot: Secondary | ICD-10-CM | POA: Diagnosis not present

## 2022-11-11 DIAGNOSIS — M67871 Other specified disorders of synovium, right ankle and foot: Secondary | ICD-10-CM | POA: Diagnosis not present

## 2022-12-06 DIAGNOSIS — Z780 Asymptomatic menopausal state: Secondary | ICD-10-CM | POA: Diagnosis not present

## 2022-12-06 DIAGNOSIS — M810 Age-related osteoporosis without current pathological fracture: Secondary | ICD-10-CM | POA: Diagnosis not present

## 2022-12-06 DIAGNOSIS — M8588 Other specified disorders of bone density and structure, other site: Secondary | ICD-10-CM | POA: Diagnosis not present

## 2022-12-09 DIAGNOSIS — M67871 Other specified disorders of synovium, right ankle and foot: Secondary | ICD-10-CM | POA: Diagnosis not present

## 2023-01-11 DIAGNOSIS — H524 Presbyopia: Secondary | ICD-10-CM | POA: Diagnosis not present

## 2023-01-11 DIAGNOSIS — H2513 Age-related nuclear cataract, bilateral: Secondary | ICD-10-CM | POA: Diagnosis not present

## 2023-02-09 DIAGNOSIS — M67871 Other specified disorders of synovium, right ankle and foot: Secondary | ICD-10-CM | POA: Diagnosis not present

## 2023-02-09 DIAGNOSIS — M25571 Pain in right ankle and joints of right foot: Secondary | ICD-10-CM | POA: Diagnosis not present

## 2023-02-16 DIAGNOSIS — Z1231 Encounter for screening mammogram for malignant neoplasm of breast: Secondary | ICD-10-CM | POA: Diagnosis not present

## 2023-02-23 DIAGNOSIS — M25571 Pain in right ankle and joints of right foot: Secondary | ICD-10-CM | POA: Diagnosis not present

## 2023-03-03 DIAGNOSIS — L739 Follicular disorder, unspecified: Secondary | ICD-10-CM | POA: Diagnosis not present

## 2023-03-03 DIAGNOSIS — L570 Actinic keratosis: Secondary | ICD-10-CM | POA: Diagnosis not present

## 2023-03-03 DIAGNOSIS — L578 Other skin changes due to chronic exposure to nonionizing radiation: Secondary | ICD-10-CM | POA: Diagnosis not present

## 2023-03-03 DIAGNOSIS — L821 Other seborrheic keratosis: Secondary | ICD-10-CM | POA: Diagnosis not present

## 2023-03-03 DIAGNOSIS — Z85828 Personal history of other malignant neoplasm of skin: Secondary | ICD-10-CM | POA: Diagnosis not present

## 2023-03-04 DIAGNOSIS — M7661 Achilles tendinitis, right leg: Secondary | ICD-10-CM | POA: Diagnosis not present

## 2023-03-04 DIAGNOSIS — M6701 Short Achilles tendon (acquired), right ankle: Secondary | ICD-10-CM | POA: Diagnosis not present

## 2023-03-04 DIAGNOSIS — M67879 Other specified disorders of synovium and tendon, unspecified ankle and foot: Secondary | ICD-10-CM | POA: Diagnosis not present

## 2023-05-22 DIAGNOSIS — Z23 Encounter for immunization: Secondary | ICD-10-CM | POA: Diagnosis not present

## 2023-10-18 DIAGNOSIS — Z0000 Encounter for general adult medical examination without abnormal findings: Secondary | ICD-10-CM | POA: Diagnosis not present

## 2023-10-18 DIAGNOSIS — E782 Mixed hyperlipidemia: Secondary | ICD-10-CM | POA: Diagnosis not present

## 2023-10-18 DIAGNOSIS — J309 Allergic rhinitis, unspecified: Secondary | ICD-10-CM | POA: Diagnosis not present

## 2023-10-18 DIAGNOSIS — M810 Age-related osteoporosis without current pathological fracture: Secondary | ICD-10-CM | POA: Diagnosis not present

## 2023-11-07 DIAGNOSIS — M810 Age-related osteoporosis without current pathological fracture: Secondary | ICD-10-CM | POA: Diagnosis not present

## 2023-11-07 DIAGNOSIS — E782 Mixed hyperlipidemia: Secondary | ICD-10-CM | POA: Diagnosis not present

## 2024-01-12 DIAGNOSIS — D3131 Benign neoplasm of right choroid: Secondary | ICD-10-CM | POA: Diagnosis not present

## 2024-01-12 DIAGNOSIS — H52201 Unspecified astigmatism, right eye: Secondary | ICD-10-CM | POA: Diagnosis not present

## 2024-01-12 DIAGNOSIS — H2513 Age-related nuclear cataract, bilateral: Secondary | ICD-10-CM | POA: Diagnosis not present

## 2024-02-20 DIAGNOSIS — Z1231 Encounter for screening mammogram for malignant neoplasm of breast: Secondary | ICD-10-CM | POA: Diagnosis not present

## 2024-03-05 DIAGNOSIS — J019 Acute sinusitis, unspecified: Secondary | ICD-10-CM | POA: Diagnosis not present

## 2024-03-05 DIAGNOSIS — H6091 Unspecified otitis externa, right ear: Secondary | ICD-10-CM | POA: Diagnosis not present

## 2024-05-23 DIAGNOSIS — Z23 Encounter for immunization: Secondary | ICD-10-CM | POA: Diagnosis not present

## 2024-07-05 DIAGNOSIS — H9313 Tinnitus, bilateral: Secondary | ICD-10-CM | POA: Diagnosis not present

## 2024-07-05 DIAGNOSIS — H8111 Benign paroxysmal vertigo, right ear: Secondary | ICD-10-CM | POA: Diagnosis not present

## 2024-07-05 DIAGNOSIS — J302 Other seasonal allergic rhinitis: Secondary | ICD-10-CM | POA: Diagnosis not present

## 2024-07-20 ENCOUNTER — Encounter (INDEPENDENT_AMBULATORY_CARE_PROVIDER_SITE_OTHER): Payer: Self-pay

## 2024-09-04 ENCOUNTER — Encounter (INDEPENDENT_AMBULATORY_CARE_PROVIDER_SITE_OTHER): Payer: Self-pay | Admitting: Otolaryngology

## 2024-09-04 ENCOUNTER — Ambulatory Visit (INDEPENDENT_AMBULATORY_CARE_PROVIDER_SITE_OTHER): Admitting: Otolaryngology

## 2024-09-04 VITALS — BP 150/90 | HR 86 | Ht 60.0 in | Wt 151.0 lb

## 2024-09-04 DIAGNOSIS — J343 Hypertrophy of nasal turbinates: Secondary | ICD-10-CM | POA: Insufficient documentation

## 2024-09-04 DIAGNOSIS — H9312 Tinnitus, left ear: Secondary | ICD-10-CM | POA: Diagnosis not present

## 2024-09-04 DIAGNOSIS — H903 Sensorineural hearing loss, bilateral: Secondary | ICD-10-CM | POA: Diagnosis not present

## 2024-09-04 DIAGNOSIS — J342 Deviated nasal septum: Secondary | ICD-10-CM | POA: Diagnosis not present

## 2024-09-04 DIAGNOSIS — J310 Chronic rhinitis: Secondary | ICD-10-CM | POA: Diagnosis not present

## 2024-09-04 NOTE — Progress Notes (Signed)
 Patient ID: [name] [name], female   DOB: 01/18/1951, 74 y.o.   MRN: [id number]  Follow up: Recurrent sinusitis, nasal congestion, left-sided tinnitus  History of Present Illness [name] [name] is a 74 year old female with chronic sinus disease and left-sided sensorineural hearing loss who presents for evaluation of persistent left ear tinnitus.  In July 2025, she developed acute sinusitis and was found to have a right middle ear effusion, which resolved following antibiotic therapy. Subsequently, she noted intermittent buzzing and occasional clicking localized to the left ear. The tinnitus is not present in the right ear, does not interfere with daily activities, and is less noticeable when she is occupied.  She has longstanding chronic nasal congestion and sinonasal symptoms. She was evaluated two months ago and was told there may be mild tympanic membrane scarring, but no middle ear effusion was present. At that time, she experienced transient lightheadedness, without vertigo, which resolved after performing the Epley maneuver nightly for ten days. She currently denies dizziness or vertigo, with only occasional lightheadedness during episodes of sinus congestion.  She has a known deviated septum and turbinate hypertrophy. She previously used intranasal steroids, which were discontinued in July 2025 due to nasal dryness and epistaxis, and was switched to azelastine nasal spray. She currently uses azelastine and denies ongoing epistaxis. Sinonasal symptoms are stable, and she has not had further episodes of sinusitis since July 2025.  She is aware of having some hearing loss, which was discussed after audiometry in February 2024.   Exam: General: Communicates without difficulty, well nourished, no acute distress. Head: Normocephalic, no evidence injury, no tenderness, facial buttresses intact without stepoff. Face/sinus: No tenderness to palpation and percussion. Facial movement is normal and  symmetric. Eyes: PERRL, EOMI. No scleral icterus, conjunctivae clear. Neuro: CN II exam reveals vision grossly intact.  No nystagmus at any point of gaze. Ears: Auricles well formed without lesions.  Ear canals are intact without mass or lesion.  No erythema or edema is appreciated.  The TMs are intact without fluid. Nose: External evaluation reveals normal support and skin without lesions.  Dorsum is intact.  Anterior rhinoscopy reveals congested mucosa over anterior aspect of inferior turbinates and deviated septum.  No purulence noted. Oral:  Oral cavity and oropharynx are intact, symmetric, without erythema or edema.  Mucosa is moist without lesions. Neck: Full range of motion without pain.  There is no significant lymphadenopathy.  No masses palpable.  Thyroid  bed within normal limits to palpation.  Parotid glands and submandibular glands equal bilaterally without mass.  Trachea is midline. Neuro:  CN 2-12 grossly intact.    Procedure:  Flexible Nasal Endoscopy: Description: Risks, benefits, and alternatives of flexible endoscopy were explained to the patient.  Specific mention was made of the risk of throat numbness with difficulty swallowing, possible bleeding from the nose and mouth, and pain from the procedure.  The patient gave oral consent to proceed.  The flexible scope was inserted into the right nasal cavity.  Endoscopy of the interior nasal cavity, superior, inferior, and middle meatus was performed. The sphenoid-ethmoid recess was examined. Edematous mucosa was noted.  No polyp, mass, or lesion was appreciated. Nasal septal deviation noted. Olfactory cleft was clear.  Nasopharynx was clear.  Turbinates were hypertrophied but without mass.  The procedure was repeated on the contralateral side with similar findings.  The patient tolerated the procedure well.  Assessment & Plan Tinnitus and sensorineural hearing loss, left ear She has chronic, mild left-sided tinnitus associated with previously  documented high-frequency sensorineural hearing loss, slightly worse on the left. Tinnitus remains well-managed and does not interfere with her quality of life. No evidence of active middle ear pathology or effusion. - Reassured her regarding the benign nature of her tinnitus and its association with sensorineural hearing loss. - Educated her on the mechanism of tinnitus and the strategies to cope with tinnitus. - Advised that tinnitus is not curable but can be managed with coping strategies. - Instructed her to return if symptoms worsen or new symptoms develop.  Chronic rhinitis with deviated septum and turbinate hypertrophy She has chronic rhinitis with persistent deviated septum and turbinate hypertrophy.  No acute infection is noted today. - Recommended resuming a steroid nasal spray (Flonase, Nasonex , or Nasacort), available over the counter, to reduce risk of future sinus infections. - Instructed on proper nasal spray technique to avoid septal contact and minimize risk of epistaxis. - Advised use of a humidifier or nasal ointment to address nasal dryness, especially during winter. - Advised continuation of azelastine for allergy  symptoms as complementary to steroid nasal spray. - Instructed her to contact the office if she develops signs of acute sinus infection for evaluation and treatment.
